# Patient Record
Sex: Male | Born: 1970 | Race: White | Hispanic: No | Marital: Married | Smoking: Never smoker
Health system: Southern US, Community
[De-identification: ages and names within clinical notes are randomized; demographics above are authoritative.]

## PROBLEM LIST (undated history)

## (undated) DIAGNOSIS — I10 Essential (primary) hypertension: Secondary | ICD-10-CM

## (undated) DIAGNOSIS — F419 Anxiety disorder, unspecified: Secondary | ICD-10-CM

## (undated) DIAGNOSIS — F329 Major depressive disorder, single episode, unspecified: Secondary | ICD-10-CM

## (undated) DIAGNOSIS — R519 Headache, unspecified: Secondary | ICD-10-CM

## (undated) DIAGNOSIS — C189 Malignant neoplasm of colon, unspecified: Secondary | ICD-10-CM

## (undated) DIAGNOSIS — F32A Depression, unspecified: Secondary | ICD-10-CM

## (undated) DIAGNOSIS — N189 Chronic kidney disease, unspecified: Secondary | ICD-10-CM

## (undated) DIAGNOSIS — Z87442 Personal history of urinary calculi: Secondary | ICD-10-CM

## (undated) DIAGNOSIS — R51 Headache: Secondary | ICD-10-CM

## (undated) HISTORY — PX: LITHOTRIPSY: SUR834

---

## 1996-09-01 HISTORY — PX: WISDOM TOOTH EXTRACTION: SHX21

## 2003-09-02 HISTORY — PX: GANGLION CYST EXCISION: SHX1691

## 2005-09-01 DIAGNOSIS — I10 Essential (primary) hypertension: Secondary | ICD-10-CM

## 2005-09-01 HISTORY — DX: Essential (primary) hypertension: I10

## 2006-08-31 ENCOUNTER — Encounter: Admission: RE | Admit: 2006-08-31 | Discharge: 2006-08-31 | Payer: Self-pay | Admitting: Family Medicine

## 2015-09-02 DIAGNOSIS — N189 Chronic kidney disease, unspecified: Secondary | ICD-10-CM | POA: Diagnosis present

## 2015-09-02 HISTORY — DX: Chronic kidney disease, unspecified: N18.9

## 2016-01-07 ENCOUNTER — Encounter (HOSPITAL_COMMUNITY): Payer: Self-pay | Admitting: Emergency Medicine

## 2016-01-07 ENCOUNTER — Emergency Department (HOSPITAL_COMMUNITY)
Admission: EM | Admit: 2016-01-07 | Discharge: 2016-01-07 | Disposition: A | Discharge status: Home / Self Care | Payer: BLUE CROSS/BLUE SHIELD | Attending: Emergency Medicine | Admitting: Emergency Medicine

## 2016-01-07 ENCOUNTER — Emergency Department (HOSPITAL_COMMUNITY): Payer: BLUE CROSS/BLUE SHIELD

## 2016-01-07 DIAGNOSIS — N132 Hydronephrosis with renal and ureteral calculous obstruction: Secondary | ICD-10-CM | POA: Diagnosis not present

## 2016-01-07 DIAGNOSIS — I10 Essential (primary) hypertension: Secondary | ICD-10-CM | POA: Insufficient documentation

## 2016-01-07 DIAGNOSIS — N201 Calculus of ureter: Secondary | ICD-10-CM | POA: Insufficient documentation

## 2016-01-07 DIAGNOSIS — K2970 Gastritis, unspecified, without bleeding: Secondary | ICD-10-CM | POA: Diagnosis not present

## 2016-01-07 DIAGNOSIS — Z7951 Long term (current) use of inhaled steroids: Secondary | ICD-10-CM | POA: Insufficient documentation

## 2016-01-07 DIAGNOSIS — Z79899 Other long term (current) drug therapy: Secondary | ICD-10-CM | POA: Diagnosis not present

## 2016-01-07 DIAGNOSIS — R110 Nausea: Secondary | ICD-10-CM | POA: Diagnosis not present

## 2016-01-07 DIAGNOSIS — R109 Unspecified abdominal pain: Secondary | ICD-10-CM | POA: Diagnosis not present

## 2016-01-07 LAB — URINE MICROSCOPIC-ADD ON: Squamous Epithelial / LPF: NONE SEEN

## 2016-01-07 LAB — COMPREHENSIVE METABOLIC PANEL
ALT: 15 U/L — ABNORMAL LOW (ref 17–63)
AST: 19 U/L (ref 15–41)
Albumin: 4.3 g/dL (ref 3.5–5.0)
Alkaline Phosphatase: 72 U/L (ref 38–126)
Anion gap: 9 (ref 5–15)
BUN: 12 mg/dL (ref 6–20)
CO2: 25 mmol/L (ref 22–32)
Calcium: 8.9 mg/dL (ref 8.9–10.3)
Chloride: 111 mmol/L (ref 101–111)
Creatinine, Ser: 1.12 mg/dL (ref 0.61–1.24)
GFR calc Af Amer: >60 mL/min (ref >60)
GFR calc non Af Amer: >60 mL/min (ref >60)
Glucose, Bld: 139 mg/dL — ABNORMAL HIGH (ref 65–99)
Potassium: 3.5 mmol/L (ref 3.5–5.1)
Sodium: 145 mmol/L (ref 135–145)
Total Bilirubin: 0.9 mg/dL (ref 0.3–1.2)
Total Protein: 7.1 g/dL (ref 6.5–8.1)

## 2016-01-07 LAB — CBC WITH DIFFERENTIAL/PLATELET
Basophils Absolute: 0 10*3/uL (ref 0.0–0.1)
Basophils Relative: 0 %
Eosinophils Absolute: 0.2 10*3/uL (ref 0.0–0.7)
Eosinophils Relative: 1 %
HCT: 38.6 % — ABNORMAL LOW (ref 39.0–52.0)
Hemoglobin: 14 g/dL (ref 13.0–17.0)
Lymphocytes Relative: 11 %
Lymphs Abs: 1.4 10*3/uL (ref 0.7–4.0)
MCH: 30.7 pg (ref 26.0–34.0)
MCHC: 36.3 g/dL — ABNORMAL HIGH (ref 30.0–36.0)
MCV: 84.6 fL (ref 78.0–100.0)
Monocytes Absolute: 0.8 10*3/uL (ref 0.1–1.0)
Monocytes Relative: 6 %
Neutro Abs: 10.7 10*3/uL — ABNORMAL HIGH (ref 1.7–7.7)
Neutrophils Relative %: 82 %
Platelets: 198 10*3/uL (ref 150–400)
RBC: 4.56 MIL/uL (ref 4.22–5.81)
RDW: 12.4 % (ref 11.5–15.5)
WBC: 13.1 10*3/uL — ABNORMAL HIGH (ref 4.0–10.5)

## 2016-01-07 LAB — URINALYSIS, ROUTINE W REFLEX MICROSCOPIC
Bilirubin Urine: NEGATIVE
Glucose, UA: NEGATIVE mg/dL
Ketones, ur: NEGATIVE mg/dL
Leukocytes, UA: NEGATIVE
Nitrite: NEGATIVE
Protein, ur: NEGATIVE mg/dL
Specific Gravity, Urine: 1.021 (ref 1.005–1.030)
pH: 6 (ref 5.0–8.0)

## 2016-01-07 MED ORDER — TAMSULOSIN HCL 0.4 MG PO CAPS: 0.4000 mg | ORAL_CAPSULE | Freq: Every day | ORAL | Status: DC

## 2016-01-07 MED ORDER — ONDANSETRON HCL 4 MG/2ML IJ SOLN
4.0000 mg | Freq: Once | INTRAMUSCULAR | Status: AC
  Administered 2016-01-07: 4 mg via INTRAVENOUS
  Filled 2016-01-07: qty 2

## 2016-01-07 MED ORDER — SODIUM CHLORIDE 0.9 % IV SOLN
1000.0000 mL | Freq: Once | INTRAVENOUS | Status: AC
  Administered 2016-01-07: 1000 mL via INTRAVENOUS

## 2016-01-07 MED ORDER — KETOROLAC TROMETHAMINE 30 MG/ML IJ SOLN
30.0000 mg | Freq: Once | INTRAMUSCULAR | Status: AC
  Administered 2016-01-07: 30 mg via INTRAVENOUS
  Filled 2016-01-07: qty 1

## 2016-01-07 MED ORDER — ONDANSETRON 8 MG PO TBDP: 8.0000 mg | ORAL_TABLET | Freq: Three times a day (TID) | ORAL | Status: DC | PRN

## 2016-01-07 MED ORDER — OXYCODONE-ACETAMINOPHEN 5-325 MG PO TABS: 1.0000 | ORAL_TABLET | ORAL | Status: DC | PRN

## 2016-01-07 MED ORDER — IBUPROFEN 600 MG PO TABS: 600.0000 mg | ORAL_TABLET | Freq: Three times a day (TID) | ORAL | Status: DC | PRN

## 2016-01-07 MED ORDER — HYDROMORPHONE HCL 1 MG/ML IJ SOLN
1.0000 mg | INTRAMUSCULAR | Status: DC | PRN
  Administered 2016-01-07 (×2): 1 mg via INTRAVENOUS
  Filled 2016-01-07 (×2): qty 1

## 2016-01-07 MED ORDER — SODIUM CHLORIDE 0.9 % IV SOLN
1000.0000 mL | INTRAVENOUS | Status: DC
  Administered 2016-01-07: 1000 mL via INTRAVENOUS

## 2016-01-07 MED ORDER — HYDROMORPHONE HCL 1 MG/ML IJ SOLN: 1.0000 mg | Freq: Once | INTRAMUSCULAR | Status: DC

## 2016-01-07 NOTE — ED Notes (Signed)
Pt reports understanding of discharge information. No questions at time of discharge 

## 2016-01-07 NOTE — ED Notes (Signed)
Per EMS pt reports abd pain in right lower abd with nausea and vomiting since Friday. Pt reports having 5 episodes of vomiting. EMS administered 4mg  Zofran IVP and 100mg  Fentanyl IVP.

## 2016-01-07 NOTE — ED Notes (Signed)
Patient transported to CT 

## 2016-01-07 NOTE — Discharge Instructions (Signed)
Renal Colic  Renal colic is pain that is caused by passing a kidney stone. The pain can be sharp and severe. It may be felt in the back, abdomen, side (flank), or groin. It can cause nausea. Renal colic can come and go.  HOME CARE INSTRUCTIONS  Watch your condition for any changes. The following actions may help to lessen any discomfort that you are feeling:  · Take medicines only as directed by your health care provider.  · Ask your health care provider if it is okay to take over-the-counter pain medicine.  · Drink enough fluid to keep your urine clear or pale yellow. Drink 6-8 glasses of water each day.  · Limit the amount of salt that you eat to less than 2 grams per day.  · Reduce the amount of protein in your diet. Eat less meat, fish, nuts, and dairy.  · Avoid foods such as spinach, rhubarb, nuts, or bran. These may make kidney stones more likely to form.  SEEK MEDICAL CARE IF:  · You have a fever or chills.  · Your urine smells bad or looks cloudy.  · You have pain or burning when you pass urine.  SEEK IMMEDIATE MEDICAL CARE IF:  · Your flank pain or groin pain suddenly worsens.  · You become confused or disoriented or you lose consciousness.     This information is not intended to replace advice given to you by your health care provider. Make sure you discuss any questions you have with your health care provider.     Document Released: 05/28/2005 Document Revised: 09/08/2014 Document Reviewed: 06/28/2014  Elsevier Interactive Patient Education ©2016 [employer]vier [employer]

## 2016-01-07 NOTE — ED Notes (Signed)
Bed: [id number] Expected date:  Expected time:  Means of arrival:  Comments: EMS 86M nausea, abd pain, no BM for several days

## 2016-01-07 NOTE — ED Notes (Signed)
Pt reports increase in pain. Also pt informed of need for urine sample and that catherization would be done if unable to urinate.

## 2016-01-07 NOTE — ED Notes (Signed)
Writer made pt aware that urine is needed for sample, urinal at bedside.

## 2016-01-07 NOTE — ED Notes (Signed)
MD at bedside. 

## 2016-01-07 NOTE — ED Provider Notes (Signed)
CSN: [id number]     Arrival date & time 01/07/16  0118 History  By signing my name below, I, Corey Gregory, attest that this documentation has been prepared under the direction and in the presence of [name], MD. Electronically Signed: Virgel Bouquet, ED Scribe. 01/07/2016. 1:43 AM.   Chief Complaint  Patient presents with  . Abdominal Pain   The history is provided by the patient. No language interpreter was used.   HPI Comments: Corey Gregory is a 45 y.o. male brought in by EMS with an hx of HTN who presents to the Emergency Department complaining of intermittent, moderate abdominal pain onset 3 days ago. Pt states that the pain lasts for "a little while" before resolving and intermittently radiates to his back. He notes that pain began shortly after eating a meal 3 days ago, followed by nausea and emesis later that night.  Abdominal pain is unchanged by movement. He takes HTN medications regularly. Denies similar symptoms in the past. Denies hx of kidney stones. Denies urinary symptoms, melena, hematochezia, or any other symptoms currently.  History reviewed. No pertinent past medical history. History reviewed. No pertinent past surgical history. History reviewed. No pertinent family history. Social History  Substance Use Topics  . Smoking status: Never Smoker   . Smokeless tobacco: Never Used  . Alcohol Use: No    Review of Systems A complete 10 system review of systems was obtained and all systems are negative except as noted in the HPI and PMH.   Allergies  Review of patient's allergies indicates no known allergies.  Home Medications   Prior to Admission medications   Medication Sig Start Date End Date Taking? Authorizing Provider  ALPRAZolam (XANAX) 0.25 MG tablet Take 0.25 mg by mouth 2 (two) times daily as needed. Anxiety 12/26/15  Yes Historical Provider, MD  butorphanol (STADOL) 10 MG/ML nasal spray Place 1 spray into the nose 2 (two) times daily as needed for  headache.  12/23/15  Yes Historical Provider, MD  lisinopril-hydrochlorothiazide (PRINZIDE,ZESTORETIC) 20-12.5 MG tablet Take 1 tablet by mouth daily.   Yes Historical Provider, MD  PARoxetine (PAXIL) 20 MG tablet Take 20 mg by mouth every morning. 10/08/15  Yes Historical Provider, MD   BP 131/85 mmHg  Pulse 79  Temp(Src) 98.9 F (37.2 C) (Oral)  Resp 18  Ht 6\' 3"  (1.905 m)  Wt 255 lb (115.667 kg)  BMI 31.87 kg/m2  SpO2 97% Physical Exam  Constitutional: He is oriented to person, place, and time. He appears well-developed and well-nourished.  Uncomfortable appearing.  HENT:  Head: Normocephalic and atraumatic.  Eyes: EOM are normal.  Neck: Normal range of motion.  Cardiovascular: Normal rate, regular rhythm, normal heart sounds and intact distal pulses.   Pulmonary/Chest: Effort normal and breath sounds normal. No respiratory distress.  Abdominal: Soft. He exhibits no distension. There is tenderness in the right lower quadrant.  Mild RLQ tenderness  Musculoskeletal: Normal range of motion.  Neurological: He is alert and oriented to person, place, and time.  Skin: Skin is warm and dry.  Psychiatric: He has a normal mood and affect. Judgment normal.  Nursing note and vitals reviewed.   ED Course  Procedures   DIAGNOSTIC STUDIES: Oxygen Saturation is 93% on RA, adequate by my interpretation.    COORDINATION OF CARE: 1:42 AM Will order pain medication and abdominal CT scan. Discussed treatment plan with pt at bedside and pt agreed to plan.  Labs Review Labs Reviewed  CBC WITH DIFFERENTIAL/PLATELET - Abnormal; Notable  for the following:    WBC 13.1 (*)    HCT 38.6 (*)    MCHC 36.3 (*)    Neutro Abs 10.7 (*)    All other components within normal limits  COMPREHENSIVE METABOLIC PANEL - Abnormal; Notable for the following:    Glucose, Bld 139 (*)    ALT 15 (*)    All other components within normal limits  URINALYSIS, ROUTINE W REFLEX MICROSCOPIC (NOT AT [facility]) - Abnormal;  Notable for the following:    APPearance CLOUDY (*)    Hgb urine dipstick MODERATE (*)    All other components within normal limits  URINE MICROSCOPIC-ADD ON - Abnormal; Notable for the following:    Bacteria, UA RARE (*)    Crystals CA OXALATE CRYSTALS (*)    All other components within normal limits    Imaging Review Ct Renal Stone Study  01/07/2016  CLINICAL DATA:  Initial evaluation for acute right-sided pain, nausea, vomiting. EXAM: CT ABDOMEN AND PELVIS WITHOUT CONTRAST TECHNIQUE: Multidetector CT imaging of the abdomen and pelvis was performed following the standard protocol without IV contrast. COMPARISON:  None. FINDINGS: Patchy atelectatic changes present within the visualized lingula and lower lobes. Visualized lungs are otherwise clear. Cardiomegaly partially visualized. Trace pericardial fluid noted. Liver demonstrates a normal unenhanced appearance. Gallbladder within normal limits. No biliary dilatation. Subcentimeter hypodensity within the spleen noted, of doubtful clinical significance. Spleen otherwise unremarkable. Adrenal glands and pancreas within normal limits. Large parapelvic cyst measuring 7 Monday by 7.3 cm present within the left kidney. 3 mm nonobstructive left renal calculus present. No left-sided hydronephrosis or hydroureter. No radiopaque calculi seen along the course of the left ureter. On the right, there is an obstructive 13 mm stone lodged at the right UPJ with secondary moderate right hydronephrosis. Right perinephric and periureteral fat stranding. Right ureter is decompressed distally without additional stones. Additional nonobstructive 8 mm calculus within the interpolar right kidney. Few additional smaller subcentimeter nonobstructive stones noted within the upper and lower poles of the right kidney. Stomach within normal limits. No evidence for bowel obstruction. No abnormal wall thickening, mucosal enhancement, or inflammatory fat stranding seen about the bowels.  Appendix is normal. Bladder within normal limits.  Prostate normal. No free air or fluid.  No adenopathy. Mild levoscoliosis. No acute osseus abnormality. No worrisome lytic or blastic osseous lesions. Degenerative disc bulge noted at L5-S1. IMPRESSION: 1. 13 mm obstructive stone at the right UPJ with secondary moderate right hydronephrosis. 2. Additional nonobstructive bilateral nephrolithiasis as above. 3. No other acute intra-abdominal or pelvic process. 4. Mild levoscoliosis with degenerative disc bulge at L5-S1. 5. Cardiomegaly. Electronically Signed   By: Jeannine Boga M.D.   On: 01/07/2016 02:42   I have personally reviewed and evaluated these images and lab results as part of my medical decision-making.   EKG Interpretation None      MDM   Final diagnoses:  Right ureteral stone    5:02 AM Large 13 mm distal right ureteral stone.  No signs of infection.  Pain controlled at this time.  Patient understands return to the emergency department for new or worsening symptoms.  Close outpatient urology follow-up.  Strict return precautions and standard stone precautions given.  Patient and family in agreement.  All questions answered  I personally performed the services described in this documentation, which was scribed in my presence. The recorded information has been reviewed and is accurate.       [name], MD 01/07/16 [phone]

## 2016-01-08 ENCOUNTER — Other Ambulatory Visit: Payer: Self-pay | Admitting: Urology

## 2016-01-08 DIAGNOSIS — Z0000 Encounter for general adult medical examination without abnormal findings: Secondary | ICD-10-CM | POA: Diagnosis not present

## 2016-01-08 DIAGNOSIS — N201 Calculus of ureter: Secondary | ICD-10-CM | POA: Diagnosis not present

## 2016-01-09 ENCOUNTER — Encounter (HOSPITAL_COMMUNITY): Payer: Self-pay | Admitting: *Deleted

## 2016-01-10 ENCOUNTER — Ambulatory Visit (HOSPITAL_COMMUNITY): Payer: BLUE CROSS/BLUE SHIELD

## 2016-01-10 ENCOUNTER — Ambulatory Visit (HOSPITAL_COMMUNITY)
Admission: RE | Admit: 2016-01-10 | Discharge: 2016-01-10 | Disposition: A | Discharge status: Home / Self Care | Payer: BLUE CROSS/BLUE SHIELD | Source: Ambulatory Visit | Attending: Urology | Admitting: Urology

## 2016-01-10 ENCOUNTER — Encounter (HOSPITAL_COMMUNITY): Payer: Self-pay | Admitting: *Deleted

## 2016-01-10 ENCOUNTER — Encounter (HOSPITAL_COMMUNITY)
Admission: RE | Disposition: A | Discharge status: Home / Self Care | Payer: Self-pay | Source: Ambulatory Visit | Attending: Urology

## 2016-01-10 DIAGNOSIS — F329 Major depressive disorder, single episode, unspecified: Secondary | ICD-10-CM | POA: Insufficient documentation

## 2016-01-10 DIAGNOSIS — Z6830 Body mass index (BMI) 30.0-30.9, adult: Secondary | ICD-10-CM | POA: Insufficient documentation

## 2016-01-10 DIAGNOSIS — N201 Calculus of ureter: Secondary | ICD-10-CM

## 2016-01-10 DIAGNOSIS — Z79899 Other long term (current) drug therapy: Secondary | ICD-10-CM | POA: Insufficient documentation

## 2016-01-10 DIAGNOSIS — Z841 Family history of disorders of kidney and ureter: Secondary | ICD-10-CM | POA: Diagnosis not present

## 2016-01-10 DIAGNOSIS — N200 Calculus of kidney: Secondary | ICD-10-CM | POA: Diagnosis not present

## 2016-01-10 DIAGNOSIS — I10 Essential (primary) hypertension: Secondary | ICD-10-CM | POA: Insufficient documentation

## 2016-01-10 DIAGNOSIS — F419 Anxiety disorder, unspecified: Secondary | ICD-10-CM | POA: Insufficient documentation

## 2016-01-10 DIAGNOSIS — E669 Obesity, unspecified: Secondary | ICD-10-CM | POA: Diagnosis not present

## 2016-01-10 HISTORY — DX: Major depressive disorder, single episode, unspecified: F32.9

## 2016-01-10 HISTORY — DX: Depression, unspecified: F32.A

## 2016-01-10 HISTORY — DX: Essential (primary) hypertension: I10

## 2016-01-10 HISTORY — DX: Headache: R51

## 2016-01-10 HISTORY — DX: Chronic kidney disease, unspecified: N18.9

## 2016-01-10 HISTORY — DX: Anxiety disorder, unspecified: F41.9

## 2016-01-10 HISTORY — DX: Headache, unspecified: R51.9

## 2016-01-10 SURGERY — LITHOTRIPSY, ESWL
Anesthesia: LOCAL | Laterality: Right

## 2016-01-10 MED ORDER — DIPHENHYDRAMINE HCL 25 MG PO CAPS
25.0000 mg | ORAL_CAPSULE | ORAL | Status: AC
  Administered 2016-01-10: 25 mg via ORAL
  Filled 2016-01-10: qty 1

## 2016-01-10 MED ORDER — HYDRALAZINE HCL 20 MG/ML IJ SOLN
10.0000 mg | Freq: Once | INTRAMUSCULAR | Status: DC
  Filled 2016-01-10: qty 1

## 2016-01-10 MED ORDER — HYDRALAZINE HCL 20 MG/ML IJ SOLN
10.0000 mg | Freq: Once | INTRAMUSCULAR | Status: AC
  Administered 2016-01-10: 10 mg via INTRAVENOUS

## 2016-01-10 MED ORDER — DIAZEPAM 5 MG PO TABS
10.0000 mg | ORAL_TABLET | ORAL | Status: AC
  Administered 2016-01-10: 10 mg via ORAL
  Filled 2016-01-10: qty 2

## 2016-01-10 MED ORDER — SODIUM CHLORIDE 0.9 % IV SOLN
INTRAVENOUS | Status: DC
  Administered 2016-01-10: 09:00:00 via INTRAVENOUS

## 2016-01-10 MED ORDER — HYDRALAZINE HCL 20 MG/ML IJ SOLN: 10.0000 mg | Freq: Once | INTRAMUSCULAR | Status: DC

## 2016-01-10 MED ORDER — CIPROFLOXACIN HCL 500 MG PO TABS
500.0000 mg | ORAL_TABLET | ORAL | Status: AC
  Administered 2016-01-10: 500 mg via ORAL
  Filled 2016-01-10: qty 1

## 2016-01-10 NOTE — Op Note (Signed)
See [facility] OP note scanned into chart. Also because of the size, density, location and other factors that cannot be anticipated I feel this will likely be a staged procedure. This fact supersedes any indication in the scanned [facility] operative note to the contrary.  

## 2016-01-10 NOTE — Progress Notes (Signed)
BP 171/98 Apresoline 10mg  IV given as ordered. Pt resting comfortably in chair

## 2016-01-10 NOTE — Progress Notes (Signed)
Hypertension post procedure reported to Dr Louis Meckel.  See orders.

## 2016-01-10 NOTE — H&P (Signed)
Reason For Visit Right-sided kidney stones   History of Present Illness 45 year old male who was seen in follow-up from the emergency department where he was seen on 01/07/16 for progressively worsening right-sided abdominal pain and flank pain. The patient states that his symptoms began 3 days prior to presentation. He describes the pain as a very sharp intense pain in the right costal vertebral angle/flank. The pain is worse with palpation. It radiates across to the front. He has had associated nausea and vomiting. He denies any voiding symptoms. The pain has been constant but relieved with narcotic pain medications. He denies any fevers or chills. The patient has no history of kidney stones. In the emergency department the patient underwent a CT scan which demonstrated a 12-13 mm stone at the right UPJ with associated hydronephrosis. His pain was relatively well-controlled and there was no evidence of acute kidney injury or infection. Subsequently was discharged home with pain control. In the interval the patient has had ongoing pain but this is being controlled with oxycodone. Without the pain medications the patient is unbearable.   Past Medical History Problems  1. History of Anxiety (F41.9) 2. History of depression (Z86.59) 3. History of hypertension (Z86.79)  Surgical History Problems  1. History of No Surgical Problems  Current Meds 1. Butorphanol Tartrate 10 MG/ML Nasal Solution;  Therapy: (Recorded:09May2017) to Recorded 2. Lisinopril TABS;  Therapy: (Recorded:09May2017) to Recorded 3. Paxil 20 MG Oral Tablet (PARoxetine HCl);  Therapy: (Recorded:09May2017) to Recorded 4. Xanax TABS (ALPRAZolam);  Therapy: (Recorded:09May2017) to Recorded  Allergies Medication  1. No Known Drug Allergies  Family History Problems  1. Family history of kidney stones (Z84.1) : Father 2. Family history of malignant neoplasm of prostate [id number]) : Father  Social History Problems    Denied:  History of Alcohol use   Denied: History of Caffeine use   Married   Never a smoker   Number of children   1 son   Occupation   Animator  Review of Systems Genitourinary, constitutional, skin, eye, otolaryngeal, hematologic/lymphatic, cardiovascular, pulmonary, endocrine, musculoskeletal, gastrointestinal, neurological and psychiatric system(s) were reviewed and pertinent findings if present are noted and are otherwise negative.  Genitourinary: urinary frequency and nocturia.  Gastrointestinal: nausea, vomiting and constipation.  Constitutional: fever, night sweats and feeling tired (fatigue).    Vitals Vital Signs [Data Includes: Last 1 Day]  Recorded: [id number] 12:46PM  Height: 6 ft 3 in Weight: 250 lb  BMI Calculated: 31.25 BSA Calculated: 2.41 Blood Pressure: 145 / 88 Temperature: 97.9 F Heart Rate: 74  Physical Exam Constitutional: Well nourished and well developed . In acute distress.  ENT:. The ears and nose are normal in appearance.  Neck: The appearance of the neck is normal and no neck mass is present.  Pulmonary: No respiratory distress and normal respiratory rhythm and effort.  Cardiovascular: Heart rate and rhythm are normal . No peripheral edema.  Abdomen: The abdomen is soft and nontender. No masses are palpated. Right CVA tenderness. No hernias are palpable. No hepatosplenomegaly noted.  Rectal: Rectal exam demonstrates normal sphincter tone, no tenderness and no masses. The prostate has no nodularity and is not tender. The left seminal vesicle is nonpalpable. The right seminal vesicle is nonpalpable. The perineum is normal on inspection.  Genitourinary: Examination of the penis demonstrates no discharge, no masses, no lesions and a normal meatus. The scrotum is without lesions. The right epididymis is palpably normal and non-tender. The left epididymis is palpably normal and non-tender. The right testis is  non-tender and without masses. The left testis is  non-tender and without masses.  Lymphatics: The femoral and inguinal nodes are not enlarged or tender.  Skin: Normal skin turgor, no visible rash and no visible skin lesions.  Neuro/Psych:. Mood and affect are appropriate.    Results/Data Urine [Data Includes: Last 1 Day]   [id number]  COLOR YELLOW   APPEARANCE CLEAR   SPECIFIC GRAVITY 1.025   pH 5.5   GLUCOSE NEGATIVE   BILIRUBIN NEGATIVE   KETONE NEGATIVE   BLOOD 2+   PROTEIN NEGATIVE   NITRITE NEGATIVE   LEUKOCYTE ESTERASE NEGATIVE   SQUAMOUS EPITHELIAL/HPF 0-5 HPF  WBC NONE SEEN WBC/HPF  RBC 3-10 RBC/HPF  BACTERIA NONE SEEN HPF  CRYSTALS NONE SEEN HPF  CASTS NONE SEEN LPF  Yeast NONE SEEN HPF   I've independently reviewed the patient's CT scan as well as read the results.  Urinalysis today demonstrates microscopic hematuria without evidence of infection.   Assessment Assessed  1. Right ureteral calculus (N20.1)  Plan Health Maintenance  1. UA With REFLEX; [Do Not Release]; Status:Complete;   Done: [id number] 12:22PM Right ureteral calculus  2. Start: OxyCODONE HCl - 5 MG Oral Tablet; TAKE 1 TO 2 TABLETS EVERY 4 HOURS AS  NEEDED FOR PAIN 3. Follow-up Schedule Surgery Office  Follow-up  Status: Hold For - Appointment   Requested for: [id number]  Discussion/Summary The patient has a large proximal right ureteral stone. We went over the treatment options with the patient including urgent stent placement given his poorly controlled pain today. Also discussed scheduled ureteroscopy over the next day or 2. And finally we discussed shockwave lithotripsy. I went over all of these options with the patient in significant detail. Ultimately, the patient has opted to proceed with shockwave lithotripsy. I spoke with the patient about large volume of stone and the chance that he may require additional procedures due to poor fragmentation. We also briefly discussed the concept of perinephric hematoma. However after detailing the risks  and the benefits of the operation the patient would like to proceed. We'll try to get him scheduled in the coming days for the next shockwave lithotripsy appointment.

## 2016-01-10 NOTE — Discharge Instructions (Signed)
Moderate Conscious Sedation, Adult, Care After Refer to this sheet in the next few weeks. These instructions provide you with information on caring for yourself after your procedure. Your health care provider may also give you more specific instructions. Your treatment has been planned according to current medical practices, but problems sometimes occur. Call your health care provider if you have any problems or questions after your procedure. WHAT TO EXPECT AFTER THE PROCEDURE  After your procedure:  You may feel sleepy, clumsy, and have poor balance for several hours.  Vomiting may occur if you eat too soon after the procedure. HOME CARE INSTRUCTIONS  Do not participate in any activities where you could become injured for at least 24 hours. Do not:  Drive.  Swim.  Ride a bicycle.  Operate heavy machinery.  Cook.  Use power tools.  Climb ladders.  Work from a high place.  Do not make important decisions or sign legal documents until you are improved.  If you vomit, drink water, juice, or soup when you can drink without vomiting. Make sure you have little or no nausea before eating solid foods.  Only take over-the-counter or prescription medicines for pain, discomfort, or fever as directed by your health care provider.  Make sure you and your family fully understand everything about the medicines given to you, including what side effects may occur.  You should not drink alcohol, take sleeping pills, or take medicines that cause drowsiness for at least 24 hours.  If you smoke, do not smoke without supervision.  If you are feeling better, you may resume normal activities 24 hours after you were sedated.  Keep all appointments with your health care provider. SEEK MEDICAL CARE IF:  Your skin is pale or bluish in color.  You continue to feel nauseous or vomit.  Your pain is getting worse and is not helped by medicine.  You have bleeding or swelling.  You are still  sleepy or feeling clumsy after 24 hours. SEEK IMMEDIATE MEDICAL CARE IF:  You develop a rash.  You have difficulty breathing.  You develop any type of allergic problem.  You have a fever. MAKE SURE YOU:  Understand these instructions.  Will watch your condition.  Will get help right away if you are not doing well or get worse.   This information is not intended to replace advice given to you by your health care provider. Make sure you discuss any questions you have with your health care provider.   Document Released: 06/08/2013 Document Revised: 09/08/2014 Document Reviewed: 06/08/2013 Elsevier Interactive Patient Education 2016 [facility] discharge instructions

## 2016-01-24 DIAGNOSIS — N201 Calculus of ureter: Secondary | ICD-10-CM | POA: Diagnosis not present

## 2016-01-24 DIAGNOSIS — Z0000 Encounter for general adult medical examination without abnormal findings: Secondary | ICD-10-CM | POA: Diagnosis not present

## 2016-02-28 DIAGNOSIS — N201 Calculus of ureter: Secondary | ICD-10-CM | POA: Diagnosis not present

## 2016-03-03 ENCOUNTER — Other Ambulatory Visit: Payer: Self-pay | Admitting: Urology

## 2016-03-11 ENCOUNTER — Encounter (HOSPITAL_COMMUNITY): Payer: Self-pay

## 2016-03-11 ENCOUNTER — Encounter (HOSPITAL_COMMUNITY)
Admission: RE | Admit: 2016-03-11 | Discharge: 2016-03-11 | Disposition: A | Discharge status: Home / Self Care | Payer: BLUE CROSS/BLUE SHIELD | Source: Ambulatory Visit | Attending: Urology | Admitting: Urology

## 2016-03-11 DIAGNOSIS — Z79899 Other long term (current) drug therapy: Secondary | ICD-10-CM | POA: Diagnosis not present

## 2016-03-11 DIAGNOSIS — F329 Major depressive disorder, single episode, unspecified: Secondary | ICD-10-CM | POA: Diagnosis not present

## 2016-03-11 DIAGNOSIS — I10 Essential (primary) hypertension: Secondary | ICD-10-CM | POA: Diagnosis not present

## 2016-03-11 DIAGNOSIS — Z841 Family history of disorders of kidney and ureter: Secondary | ICD-10-CM | POA: Diagnosis not present

## 2016-03-11 DIAGNOSIS — N132 Hydronephrosis with renal and ureteral calculous obstruction: Secondary | ICD-10-CM | POA: Diagnosis not present

## 2016-03-11 DIAGNOSIS — F419 Anxiety disorder, unspecified: Secondary | ICD-10-CM | POA: Diagnosis not present

## 2016-03-11 LAB — BASIC METABOLIC PANEL
Anion gap: 6 (ref 5–15)
BUN: 14 mg/dL (ref 6–20)
CO2: 29 mmol/L (ref 22–32)
Calcium: 9.4 mg/dL (ref 8.9–10.3)
Chloride: 104 mmol/L (ref 101–111)
Creatinine, Ser: 1.16 mg/dL (ref 0.61–1.24)
GFR calc Af Amer: >60 mL/min (ref >60)
GFR calc non Af Amer: >60 mL/min (ref >60)
Glucose, Bld: 104 mg/dL — ABNORMAL HIGH (ref 65–99)
Potassium: 4 mmol/L (ref 3.5–5.1)
Sodium: 139 mmol/L (ref 135–145)

## 2016-03-11 LAB — CBC
HCT: 40.4 % (ref 39.0–52.0)
Hemoglobin: 14.1 g/dL (ref 13.0–17.0)
MCH: 30.1 pg (ref 26.0–34.0)
MCHC: 34.9 g/dL (ref 30.0–36.0)
MCV: 86.3 fL (ref 78.0–100.0)
Platelets: 205 10*3/uL (ref 150–400)
RBC: 4.68 MIL/uL (ref 4.22–5.81)
RDW: 12.7 % (ref 11.5–15.5)
WBC: 8.9 10*3/uL (ref 4.0–10.5)

## 2016-03-11 NOTE — Patient Instructions (Signed)
Corey Gregory  03/11/2016   Your procedure is scheduled on: Friday 03/14/2016  Report to [facility]  Entrance take [place]  elevators to 3rd floor to  [place] at [place]  AM.  Call this number if you have problems the morning of surgery [phone]   Remember: ONLY 1 PERSON MAY GO WITH YOU TO SHORT STAY TO GET  READY MORNING OF [place].   Do not eat food or drink liquids :After Midnight.     Take these medicines the morning of surgery with A SIP OF WATER: none                                  You may not have any metal on your body including hair pins and              piercings  Do not wear jewelry, make-up, lotions, powders or perfumes, deodorant             Do not wear nail polish.  Do not shave  48 hours prior to surgery.              Men may shave face and neck.   Do not bring valuables to the hospital. [place].  Contacts, dentures or bridgework may not be worn into surgery.  Leave suitcase in the car. After surgery it may be brought to your room.     Patients discharged the day of surgery will not be allowed to drive home.  Name and phone number of your driver:spouse- Meredith  Special Instructions: N/A              Please read over the following fact sheets you were given: _____________________________________________________________________             [facility] - Preparing for Surgery Before surgery, you can play an important role.  Because skin is not sterile, your skin needs to be as free of germs as possible.  You can reduce the number of germs on your skin by washing with CHG (chlorahexidine gluconate) soap before surgery.  CHG is an antiseptic cleaner which kills germs and bonds with the skin to continue killing germs even after washing. Please DO NOT use if you have an allergy to CHG or antibacterial soaps.  If your skin becomes reddened/irritated stop using the CHG and  inform your nurse when you arrive at Short Stay. Do not shave (including legs and underarms) for at least 48 hours prior to the first CHG shower.  You may shave your face/neck. Please follow these instructions carefully:  1.  Shower with CHG Soap the night before surgery and the  morning of Surgery.  2.  If you choose to wash your hair, wash your hair first as usual with your  normal  shampoo.  3.  After you shampoo, rinse your hair and body thoroughly to remove the  shampoo.                           4.  Use CHG as you would any other liquid soap.  You can apply chg directly  to the skin and wash  Gently with a scrungie or clean washcloth.  5.  Apply the CHG Soap to your body ONLY FROM THE NECK DOWN.   Do not use on face/ open                           Wound or open sores. Avoid contact with eyes, ears mouth and genitals (private parts).                       Wash face,  Genitals (private parts) with your normal soap.             6.  Wash thoroughly, paying special attention to the area where your surgery  will be performed.  7.  Thoroughly rinse your body with warm water from the neck down.  8.  DO NOT shower/wash with your normal soap after using and rinsing off  the CHG Soap.                9.  Pat yourself dry with a clean towel.            10.  Wear clean pajamas.            11.  Place clean sheets on your bed the night of your first shower and do not  sleep with pets. Day of Surgery : Do not apply any lotions/deodorants the morning of surgery.  Please wear clean clothes to the hospital/surgery center.  FAILURE TO FOLLOW THESE INSTRUCTIONS MAY RESULT IN THE CANCELLATION OF YOUR SURGERY PATIENT SIGNATURE_________________________________  NURSE SIGNATURE__________________________________  ________________________________________________________________________

## 2016-03-14 ENCOUNTER — Ambulatory Visit (HOSPITAL_COMMUNITY)
Admission: RE | Admit: 2016-03-14 | Discharge: 2016-03-14 | Disposition: A | Discharge status: Home / Self Care | Payer: BLUE CROSS/BLUE SHIELD | Source: Ambulatory Visit | Attending: Urology | Admitting: Urology

## 2016-03-14 ENCOUNTER — Ambulatory Visit (HOSPITAL_COMMUNITY): Payer: BLUE CROSS/BLUE SHIELD | Admitting: Registered Nurse

## 2016-03-14 ENCOUNTER — Encounter (HOSPITAL_COMMUNITY)
Admission: RE | Disposition: A | Discharge status: Home / Self Care | Payer: Self-pay | Source: Ambulatory Visit | Attending: Urology

## 2016-03-14 ENCOUNTER — Encounter (HOSPITAL_COMMUNITY): Payer: Self-pay | Admitting: *Deleted

## 2016-03-14 DIAGNOSIS — N201 Calculus of ureter: Secondary | ICD-10-CM | POA: Diagnosis not present

## 2016-03-14 DIAGNOSIS — F329 Major depressive disorder, single episode, unspecified: Secondary | ICD-10-CM | POA: Insufficient documentation

## 2016-03-14 DIAGNOSIS — F419 Anxiety disorder, unspecified: Secondary | ICD-10-CM | POA: Diagnosis not present

## 2016-03-14 DIAGNOSIS — I10 Essential (primary) hypertension: Secondary | ICD-10-CM | POA: Insufficient documentation

## 2016-03-14 DIAGNOSIS — Z841 Family history of disorders of kidney and ureter: Secondary | ICD-10-CM | POA: Insufficient documentation

## 2016-03-14 DIAGNOSIS — N200 Calculus of kidney: Secondary | ICD-10-CM

## 2016-03-14 DIAGNOSIS — N132 Hydronephrosis with renal and ureteral calculous obstruction: Secondary | ICD-10-CM | POA: Insufficient documentation

## 2016-03-14 DIAGNOSIS — Z79899 Other long term (current) drug therapy: Secondary | ICD-10-CM | POA: Insufficient documentation

## 2016-03-14 HISTORY — PX: HOLMIUM LASER APPLICATION: SHX5852

## 2016-03-14 HISTORY — PX: CYSTOSCOPY WITH RETROGRADE PYELOGRAM, URETEROSCOPY AND STENT PLACEMENT: SHX5789

## 2016-03-14 SURGERY — CYSTOURETEROSCOPY, WITH RETROGRADE PYELOGRAM AND STENT INSERTION
Anesthesia: General | Site: Ureter | Laterality: Right

## 2016-03-14 MED ORDER — DEXAMETHASONE SODIUM PHOSPHATE 10 MG/ML IJ SOLN
INTRAMUSCULAR | Status: DC | PRN
  Administered 2016-03-14: 10 mg via INTRAVENOUS

## 2016-03-14 MED ORDER — ONDANSETRON HCL 4 MG/2ML IJ SOLN
INTRAMUSCULAR | Status: DC | PRN
  Administered 2016-03-14: 4 mg via INTRAVENOUS

## 2016-03-14 MED ORDER — PROPOFOL 10 MG/ML IV BOLUS
INTRAVENOUS | Status: DC | PRN
  Administered 2016-03-14: 200 mg via INTRAVENOUS
  Administered 2016-03-14: 30 mg via INTRAVENOUS
  Administered 2016-03-14: 50 mg via INTRAVENOUS

## 2016-03-14 MED ORDER — MIDAZOLAM HCL 5 MG/5ML IJ SOLN
INTRAMUSCULAR | Status: DC | PRN
  Administered 2016-03-14: 2 mg via INTRAVENOUS

## 2016-03-14 MED ORDER — KETOROLAC TROMETHAMINE 30 MG/ML IJ SOLN
INTRAMUSCULAR | Status: DC | PRN
  Administered 2016-03-14: 30 mg via INTRAVENOUS

## 2016-03-14 MED ORDER — TRAMADOL HCL 50 MG PO TABS: 50.0000 mg | ORAL_TABLET | Freq: Four times a day (QID) | ORAL | Status: DC | PRN

## 2016-03-14 MED ORDER — LIDOCAINE HCL 2 % EX GEL
CUTANEOUS | Status: AC
  Filled 2016-03-14: qty 5

## 2016-03-14 MED ORDER — PROPOFOL 10 MG/ML IV BOLUS
INTRAVENOUS | Status: AC
  Filled 2016-03-14: qty 20

## 2016-03-14 MED ORDER — 0.9 % SODIUM CHLORIDE (POUR BTL) OPTIME
TOPICAL | Status: DC | PRN
  Administered 2016-03-14: 1000 mL

## 2016-03-14 MED ORDER — FENTANYL CITRATE (PF) 100 MCG/2ML IJ SOLN
INTRAMUSCULAR | Status: DC | PRN
  Administered 2016-03-14 (×5): 50 ug via INTRAVENOUS

## 2016-03-14 MED ORDER — CEFAZOLIN SODIUM-DEXTROSE 2-4 GM/100ML-% IV SOLN
2.0000 g | INTRAVENOUS | Status: AC
  Administered 2016-03-14: 2 g via INTRAVENOUS

## 2016-03-14 MED ORDER — LIDOCAINE HCL (CARDIAC) 20 MG/ML IV SOLN
INTRAVENOUS | Status: AC
  Filled 2016-03-14: qty 5

## 2016-03-14 MED ORDER — CIPROFLOXACIN HCL 500 MG PO TABS: 500.0000 mg | ORAL_TABLET | Freq: Once | ORAL | Status: DC

## 2016-03-14 MED ORDER — IOHEXOL 300 MG/ML  SOLN
INTRAMUSCULAR | Status: DC | PRN
  Administered 2016-03-14: 50 mL

## 2016-03-14 MED ORDER — KETOROLAC TROMETHAMINE 30 MG/ML IJ SOLN
INTRAMUSCULAR | Status: AC
  Filled 2016-03-14: qty 1

## 2016-03-14 MED ORDER — DEXAMETHASONE SODIUM PHOSPHATE 10 MG/ML IJ SOLN
INTRAMUSCULAR | Status: AC
  Filled 2016-03-14: qty 1

## 2016-03-14 MED ORDER — FENTANYL CITRATE (PF) 250 MCG/5ML IJ SOLN
INTRAMUSCULAR | Status: AC
  Filled 2016-03-14: qty 5

## 2016-03-14 MED ORDER — MIDAZOLAM HCL 2 MG/2ML IJ SOLN
INTRAMUSCULAR | Status: AC
  Filled 2016-03-14: qty 2

## 2016-03-14 MED ORDER — LIDOCAINE HCL 2 % EX GEL
CUTANEOUS | Status: DC | PRN
  Administered 2016-03-14: 1

## 2016-03-14 MED ORDER — ONDANSETRON HCL 4 MG/2ML IJ SOLN
INTRAMUSCULAR | Status: AC
  Filled 2016-03-14: qty 2

## 2016-03-14 MED ORDER — HYDROMORPHONE HCL 1 MG/ML IJ SOLN
0.2500 mg | INTRAMUSCULAR | Status: DC | PRN
Start: 2016-03-14 — End: 2016-03-14

## 2016-03-14 MED ORDER — BELLADONNA ALKALOIDS-OPIUM 16.2-60 MG RE SUPP
RECTAL | Status: AC
  Filled 2016-03-14: qty 1

## 2016-03-14 MED ORDER — LACTATED RINGERS IV SOLN
INTRAVENOUS | Status: DC | PRN
  Administered 2016-03-14 (×2): via INTRAVENOUS

## 2016-03-14 MED ORDER — PROMETHAZINE HCL 25 MG/ML IJ SOLN: 6.2500 mg | INTRAMUSCULAR | Status: DC | PRN

## 2016-03-14 MED ORDER — SODIUM CHLORIDE 0.9 % IR SOLN
Status: DC | PRN
  Administered 2016-03-14: 4000 mL

## 2016-03-14 MED ORDER — BELLADONNA ALKALOIDS-OPIUM 16.2-60 MG RE SUPP
RECTAL | Status: DC | PRN
  Administered 2016-03-14: 1 via RECTAL

## 2016-03-14 MED ORDER — CEFAZOLIN SODIUM-DEXTROSE 2-4 GM/100ML-% IV SOLN
INTRAVENOUS | Status: AC
  Filled 2016-03-14: qty 100

## 2016-03-14 MED ORDER — LIDOCAINE 2% (20 MG/ML) 5 ML SYRINGE
INTRAMUSCULAR | Status: DC | PRN
  Administered 2016-03-14: 100 mg via INTRAVENOUS

## 2016-03-14 MED ORDER — PHENAZOPYRIDINE HCL 200 MG PO TABS: 200.0000 mg | ORAL_TABLET | Freq: Three times a day (TID) | ORAL | Status: DC | PRN

## 2016-03-14 SURGICAL SUPPLY — 21 items
BAG URO CATCHER STRL LF (MISCELLANEOUS) ×3 IMPLANT
BASKET DAKOTA 1.9FR 11X120 (BASKET) IMPLANT
BASKET ZERO TIP NITINOL 2.4FR (BASKET) IMPLANT
CATH URET 5FR 28IN OPEN ENDED (CATHETERS) ×3 IMPLANT
CLOTH BEACON ORANGE TIMEOUT ST (SAFETY) ×3 IMPLANT
EXTRACTOR STONE NITINOL NGAGE (UROLOGICAL SUPPLIES) ×3 IMPLANT
FIBER LASER FLEXIVA 1000 (UROLOGICAL SUPPLIES) IMPLANT
FIBER LASER FLEXIVA 365 (UROLOGICAL SUPPLIES) IMPLANT
FIBER LASER FLEXIVA 550 (UROLOGICAL SUPPLIES) IMPLANT
FIBER LASER TRAC TIP (UROLOGICAL SUPPLIES) ×3 IMPLANT
GLOVE BIOGEL M STRL SZ7.5 (GLOVE) ×3 IMPLANT
GOWN STRL REUS W/TWL XL LVL3 (GOWN DISPOSABLE) ×3 IMPLANT
GUIDEWIRE ANG ZIPWIRE 038X150 (WIRE) IMPLANT
GUIDEWIRE STR DUAL SENSOR (WIRE) ×3 IMPLANT
MANIFOLD NEPTUNE II (INSTRUMENTS) ×3 IMPLANT
PACK CYSTO (CUSTOM PROCEDURE TRAY) ×3 IMPLANT
SHEATH ACCESS URETERAL 24CM (SHEATH) IMPLANT
SHEATH ACCESS URETERAL 38CM (SHEATH) ×3 IMPLANT
SHEATH ACCESS URETERAL 54CM (SHEATH) IMPLANT
STENT CONTOUR 6FRX28X.038 (STENTS) ×3 IMPLANT
TUBING CONNECTING 10 (TUBING) ×3 IMPLANT

## 2016-03-14 NOTE — Anesthesia Preprocedure Evaluation (Signed)
Anesthesia Evaluation  Patient identified by MRN, date of birth, ID band Patient awake    Reviewed: Allergy & Precautions, NPO status , Patient's Chart, lab work & pertinent test results  History of Anesthesia Complications Negative for: history of anesthetic complications  Airway Mallampati: II  TM Distance: >3 FB Neck ROM: Full    Dental  (+) Teeth Intact, Dental Advisory Given   Pulmonary neg pulmonary ROS,    Pulmonary exam normal        Cardiovascular hypertension, Pt. on medications negative cardio ROS Normal cardiovascular exam     Neuro/Psych  Headaches, PSYCHIATRIC DISORDERS Anxiety Depression negative neurological ROS     GI/Hepatic negative GI ROS, Neg liver ROS,   Endo/Other  negative endocrine ROS  Renal/GU   negative genitourinary   Musculoskeletal negative musculoskeletal ROS (+)   Abdominal   Peds negative pediatric ROS (+)  Hematology negative hematology ROS (+)   Anesthesia Other Findings   Reproductive/Obstetrics negative OB ROS                             Anesthesia Physical Anesthesia Plan  ASA: II  Anesthesia Plan: General   Post-op Pain Management:    Induction: Intravenous  Airway Management Planned: LMA  Additional Equipment:   Intra-op Plan:   Post-operative Plan: Extubation in OR  Informed Consent: I have reviewed the patients History and Physical, chart, labs and discussed the procedure including the risks, benefits and alternatives for the proposed anesthesia with the patient or authorized representative who has indicated his/her understanding and acceptance.   Dental advisory given  Plan Discussed with: Anesthesiologist  Anesthesia Plan Comments:         Anesthesia Quick Evaluation

## 2016-03-14 NOTE — Interval H&P Note (Signed)
History and Physical Interval Note: S/p failed ESWL for right sided ureteral stone.  After careful consideration we have collectively opted to proceed with ureteroscopy,laser lithotripsy and stent placement.  Risk and benefits of the procedure have been discussed the patient and he is fully abreast of the risk/benefits and expectations.   03/14/2016 6:01 AM  Corey Gregory  has presented today for surgery, with the diagnosis of RIGHT URETERAL CALCULI  The various methods of treatment have been discussed with the patient and family. After consideration of risks, benefits and other options for treatment, the patient has consented to  Procedure(s): CYSTOSCOPY WITH RIGHT RETROGRADE PYELOGRAM, URETEROSCOPY, LASER, BASKET EXTRACTION  AND STENT PLACEMENT (Right) HOLMIUM LASER APPLICATION (Right) as a surgical intervention .  The patient's history has been reviewed, patient examined, no change in status, stable for surgery.  I have reviewed the patient's chart and labs.  Questions were answered to the patient's satisfaction.     Louis Meckel W

## 2016-03-14 NOTE — Anesthesia Postprocedure Evaluation (Signed)
Anesthesia Post Note  Patient: Corey Gregory  Procedure(s) Performed: Procedure(s) (LRB): CYSTOSCOPY WITH RIGHT RETROGRADE PYELOGRAM, URETEROSCOPY, LASER, BASKET EXTRACTION  AND STENT PLACEMENT (Right) HOLMIUM LASER APPLICATION (Right)  Patient location during evaluation: PACU Anesthesia Type: General Level of consciousness: sedated Pain management: pain level controlled Vital Signs Assessment: post-procedure vital signs reviewed and stable Respiratory status: spontaneous breathing and respiratory function stable Cardiovascular status: stable Anesthetic complications: no    Last Vitals:  Filed Vitals:   03/14/16 1000 03/14/16 1015  BP: 121/72 123/69  Pulse: 87   Temp:  36.8 C  Resp: 11     Last Pain:  Filed Vitals:   03/14/16 1016  PainSc: 3                  Journi Moffa DANIEL

## 2016-03-14 NOTE — Transfer of Care (Signed)
Immediate Anesthesia Transfer of Care Note  Patient: Corey Gregory  Procedure(s) Performed: Procedure(s): CYSTOSCOPY WITH RIGHT RETROGRADE PYELOGRAM, URETEROSCOPY, LASER, BASKET EXTRACTION  AND STENT PLACEMENT (Right) HOLMIUM LASER APPLICATION (Right)  Patient Location: PACU  Anesthesia Type:General  Level of Consciousness:  sedated, patient cooperative and responds to stimulation  Airway & Oxygen Therapy:Patient Spontanous Breathing and Patient connected to face mask oxgen  Post-op Assessment:  Report given to PACU RN and Post -op Vital signs reviewed and stable  Post vital signs:  Reviewed and stable  Last Vitals:  Filed Vitals:   03/14/16 0551  BP: 139/82  Pulse: 73  Temp: 36.6 C  Resp: 18    Complications: No apparent anesthesia complications

## 2016-03-14 NOTE — Op Note (Signed)
Preoperative diagnosis:  1. Right UPJ stone   Postoperative diagnosis:  1. Same   Procedure: 1. Cystoscopy, right retrograde pyelogram with interpretation, right ureteroscopy and laser lithotripsy/stone removal, right ureteral stent placement  Surgeon: Ardis Hughs, MD  Anesthesia: General  Complications: None  Intraoperative findings: The right retrograde pyelogram was performed using 10 mL of [place] contrast. This demonstrated a normal caliber ureter up to the level of the UPJ where there was a large filling defect consistent with the patient's known stone. The patient did have some proximal hydronephrosis. There are no additional filling defects.  EBL: Minimal  Specimens: Kidney stone, this will be sent to like urology pathology for further stone characterization.  Indication: Corey Gregory is a 45 y.o. patient with history of a right-sided UPJ stone. The patient underwent shockwave lithotripsy several weeks ago which failed to fragment the stone completely. As such, the patient and I discussed completion ureteroscopy as a staged approach for his management of a large UPJ stone..  After reviewing the management options for treatment, he elected to proceed with the above surgical procedure(s). We have discussed the potential benefits and risks of the procedure, side effects of the proposed treatment, the likelihood of the patient achieving the goals of the procedure, and any potential problems that might occur during the procedure or recuperation. Informed consent has been obtained.  Description of procedure:  The patient was taken to the operating room and general anesthesia was induced.  The patient was placed in the dorsal lithotomy position, prepped and draped in the usual sterile fashion, and preoperative antibiotics were administered. A preoperative time-out was performed.   A 21 French 30 cystoscope with jelly passed into the patient's urethra and into the bladder. A 3 and  60 cystoscopic evaluation was performed noting no mucosal abnormalities, bladder tumors, or stones. The ureteral orifices were orthotopic.  I subsequently cannulated the right ureteral orifice with a 5 [place] open-ended ureteral catheter and injected 10 mL on the free contrast with the above findings. I then advanced a 0.038 sensor wire through the ureteral catheter and up into the right kidney. The catheter was then removed and the wire and the cystoscope then backed out gently.  I then advanced a 4/6 French semirigid ureteroscope to the patient's urethra into the bladder under video guidance. I was able to cannulate the right ureteral orifice atraumatically and advanced it up to the stone. There were some fragments that I was able to remove without fragmentation. However, I then opted to perform laser lithotripsy in order to more easily extract the large UPJ stone. In the process of fragmenting the stone using a 200  fiber with settings of point 6 J and 6 Hz the stone retropulsed into the renal pelvis. At this point I advanced a second wire through the semirigid scope and back scope out gently.   I then advanced a 12/14 [place] times 38 cm ureteral access sheath into the patient's distal ureter. Removing the inner sheath was able to advance the flexible scope through the sheath and into the proximal ureter. I then advanced into the kidney without issue. The stone was then removed using a 0 tipped basket into the upper pole. I then lasered the stone into small fragments using the same 200  fiber with the same settings of 6 Hz and point 6 J. I then removed the fragments with a in gauge basket systematically. The kidney was then reinspected after injecting contrast into the renal pelvis and using fluoroscopic  assistance to ensure that all large fragments had been removed. There was some dust that was too small to be removed from the kidney with the basket. I then gently backed the scope out removing the  sheath simultaneously to ensure that there was no significant ureteral trauma.  I then backloaded the 0.038 sensor wire over the rigid cystoscope and then reintroduce the cystoscope into the patient's bladder gently. I then advanced a 6 [place] times 28 cm double-J ureteral stent over the wire into the right ureter. I was able then to advance the stent over the wire oozing fluoroscopic guidance until the tip was noted to be in the renal pelvis. Then gently backed out the wire and advanced the stent noting a nice curl in the renal pelvis. I then pulled the scope back to the bladder neck and advanced the stent to stent was evident at the bladder neck removing the wire completely and ensuring that there was a nice curl within the bladder. I subsequently emptied the patient's bladder and removed all the residual stone fragments.  The stent tether was then secured to the dorsal aspect of the patient's penis. The patient subsequent extubated and returned to the PACU in stable condition.  Disposition: The patient is instructed to remove the stent in 5 days. He will follow-up in clinic in 6 weeks with a renal ultrasound.    Ardis Hughs, M.D.

## 2016-03-14 NOTE — Anesthesia Procedure Notes (Signed)
Procedure Name: LMA Insertion Date/Time: 03/14/2016 7:33 AM Performed by: Talbot Grumbling Pre-anesthesia Checklist: Patient identified, Emergency Drugs available, Suction available and Patient being monitored Patient Re-evaluated:Patient Re-evaluated prior to inductionOxygen Delivery Method: Circle system utilized Preoxygenation: Pre-oxygenation with 100% oxygen Intubation Type: IV induction Ventilation: Mask ventilation without difficulty LMA: LMA inserted LMA Size: 5.0 Number of attempts: 1 Placement Confirmation: positive ETCO2 and breath sounds checked- equal and bilateral Tube secured with: Tape Dental Injury: Teeth and Oropharynx as per pre-operative assessment

## 2016-03-14 NOTE — Discharge Instructions (Signed)
DISCHARGE INSTRUCTIONS FOR KIDNEY STONE/URETERAL STENT   MEDICATIONS:  1.  Resume all your other meds from home - except do not take any extra narcotic pain meds that you may have at home.  2. Pyridium is to help with the burning/stinging when you urinate. 3. Tramadol is for moderate/severe pain, otherwise taking upto 1000 mg every 6 hours of plainTylenol will help treat your pain.   4. Take Cipro one hour prior to removal of your stent.   ACTIVITY:  1. No strenuous activity x 1week  2. No driving while on narcotic pain medications  3. Drink plenty of water  4. Continue to walk at home - you can still get blood clots when you are at home, so keep active, but don't over do it.  5. May return to work/school tomorrow or when you feel ready   BATHING:  1. You can shower and we recommend daily showers  2. You have a string coming from your urethra: The stent string is attached to your ureteral stent. Do not pull on this.   SIGNS/SYMPTOMS TO CALL:  Please call us if you have a fever greater than 101.5, uncontrolled nausea/vomiting, uncontrolled pain, dizziness, unable to urinate, bloody urine, chest pain, shortness of breath, leg swelling, leg pain, redness around wound, drainage from wound, or any other concerns or questions.   You can reach [place] at [phone].   FOLLOW-UP:  1. You have an appointment in 6 weeks with a ultrasound of your kidneys prior.   2. You have a string attached to your stent, you may remove it on Wednesday July 19. To do this, pull the strings until the stents are completely removed. You may feel an odd sensation in your back.      General Anesthesia, Adult, Care After Refer to this sheet in the next few weeks. These instructions provide you with information on caring for yourself after your procedure. Your health care provider may also give you more specific instructions. Your treatment has been planned according to current medical practices, but problems sometimes  occur. Call your health care provider if you have any problems or questions after your procedure. WHAT TO EXPECT AFTER THE PROCEDURE After the procedure, it is typical to experience:  Sleepiness.  Nausea and vomiting. HOME CARE INSTRUCTIONS  For the first 24 hours after general anesthesia:  Have a responsible person with you.  Do not drive a car. If you are alone, do not take public transportation.  Do not drink alcohol.  Do not take medicine that has not been prescribed by your health care provider.  Do not sign important papers or make important decisions.  You may resume a normal diet and activities as directed by your health care provider.  Change bandages (dressings) as directed.  If you have questions or problems that seem related to general anesthesia, call the hospital and ask for the anesthetist or anesthesiologist on call. SEEK MEDICAL CARE IF:  You have nausea and vomiting that continue the day after anesthesia.  You develop a rash. SEEK IMMEDIATE MEDICAL CARE IF:   You have difficulty breathing.  You have chest pain.  You have any allergic problems.   This information is not intended to replace advice given to you by your health care provider. Make sure you discuss any questions you have with your health care provider.   Document Released: 11/24/2000 Document Revised: 09/08/2014 Document Reviewed: 12/17/2011 Elsevier Interactive Patient Education [employer].

## 2016-03-14 NOTE — H&P (Signed)
Reason For Visit Right-sided kidney stones   History of Present Illness 45 year old male who was seen in follow-up from the emergency department where he was seen on 01/07/16 for progressively worsening right-sided abdominal pain and flank pain. The patient states that his symptoms began 3 days prior to presentation. He describes the pain as a very sharp intense pain in the right costal vertebral angle/flank. The pain is worse with palpation. It radiates across to the front. He has had associated nausea and vomiting. He denies any voiding symptoms. The pain has been constant but relieved with narcotic pain medications. He denies any fevers or chills. The patient has no history of kidney stones. In the emergency department the patient underwent a CT scan which demonstrated a 12-13 mm stone at the right UPJ with associated hydronephrosis. His pain was relatively well-controlled and there was no evidence of acute kidney injury or infection. Subsequently was discharged home with pain control. In the interval the patient has had ongoing pain but this is being controlled with oxycodone. Without the pain medications the patient is unbearable.   Past Medical History Problems  1. History of Anxiety (F41.9) 2. History of depression (Z86.59) 3. History of hypertension (Z86.79)  Surgical History Problems  1. History of No Surgical Problems  Current Meds 1. Butorphanol Tartrate 10 MG/ML Nasal Solution; Therapy: (Recorded:09May2017) to Recorded 2. Lisinopril TABS; Therapy: (Recorded:09May2017) to Recorded 3. Paxil 20 MG Oral Tablet (PARoxetine HCl); Therapy: (Recorded:09May2017) to Recorded 4. Xanax TABS (ALPRAZolam); Therapy: (Recorded:09May2017) to Recorded  Allergies Medication  1. No Known Drug Allergies  Family History Problems  1. Family history of kidney stones (Z84.1) : Father 2. Family history of malignant neoplasm of prostate [id number] : Father  Social History Problems    Denied: History of Alcohol use  Denied: History of Caffeine use  Married  Never a smoker  Number of children  1 son  Occupation  Animator  Review of Systems Genitourinary, constitutional, skin, eye, otolaryngeal, hematologic/lymphatic, cardiovascular, pulmonary, endocrine, musculoskeletal, gastrointestinal, neurological and psychiatric system(s) were reviewed and pertinent findings if present are noted and are otherwise negative.  Genitourinary: urinary frequencyandnocturia.  Gastrointestinal: nausea,vomitingandconstipation.  Constitutional: fever,night sweatsandfeeling tired (fatigue).   Vitals Vital Signs [Data Includes: Last 1 Day]  Recorded: [id number] 12:46PM  Height: 6 ft 3 in Weight: 250 lb  BMI Calculated: 31.25 BSA Calculated: 2.41 Blood Pressure: 145 / 88 Temperature: 97.9 F Heart Rate: 74  Physical Exam Constitutional: Well nourishedandwell developed. In acute distress.  ENT:. The ears and nose are normal in appearance.  Neck: The appearance of the neck is normalandno neck mass is present.  Pulmonary: No respiratory distressandnormal respiratory rhythm and effort.  Cardiovascular: Heart rate and rhythm are normal. No peripheral edema.  Abdomen: The abdomen is soft and nontender. No masses are palpated. Right CVA tenderness. No hernias are palpable. No hepatosplenomegaly noted.  Rectal: Rectal exam demonstrates normal sphincter tone,no tendernessandno masses. The prostate has no nodularityandis not tender. The left seminal vesicle is nonpalpable. The right seminal vesicle is nonpalpable. The perineum is normal on inspection.  Genitourinary: Examination of the penis demonstrates no discharge,no masses,no lesionsanda normal meatus. The scrotum is without lesions. The right epididymis is palpably normalandnon-tender. The left epididymis is palpably normalandnon-tender. The right testis is non-tenderandwithout masses. The left  testis is non-tenderandwithout masses.  Lymphatics: The femoral and inguinal nodes are not enlarged or tender.  Skin: Normal skin turgor,no visible rashandno visible skin lesions.  Neuro/Psych:. Mood and affect are appropriate.   Results/Data Urine [  Data Includes: Last 1 Day]   [id number]  COLOR YELLOW   APPEARANCE CLEAR   SPECIFIC GRAVITY 1.025   pH 5.5   GLUCOSE NEGATIVE   BILIRUBIN NEGATIVE   KETONE NEGATIVE   BLOOD 2+   PROTEIN NEGATIVE   NITRITE NEGATIVE   LEUKOCYTE ESTERASE NEGATIVE   SQUAMOUS EPITHELIAL/HPF 0-5 HPF  WBC NONE SEEN WBC/HPF  RBC 3-10 RBC/HPF  BACTERIA NONE SEEN HPF  CRYSTALS NONE SEEN HPF  CASTS NONE SEEN LPF  Yeast NONE SEEN HPF   I've independently reviewed the patient's CT scan as well as read the results.  Urinalysis today demonstrates microscopic hematuria without evidence of infection.   Assessment Assessed  1. Right ureteral calculus (N20.1)  Plan Health Maintenance  1. UA With REFLEX; [Do Not Release]; Status:Complete; Done: [id number] 12:22PM Right ureteral calculus  2. Start: OxyCODONE HCl - 5 MG Oral Tablet; TAKE 1 TO 2 TABLETS EVERY 4 HOURS AS NEEDED FOR PAIN 3. Follow-up Schedule Surgery Office Follow-up Status: Hold For - Appointment  Requested for: [id number]  Discussion/Summary The patient has a large proximal right ureteral stone. We went over the treatment options with the patient including urgent stent placement given his poorly controlled pain today. Also discussed scheduled ureteroscopy over the next day or 2. And finally we discussed shockwave lithotripsy. I went over all of these options with the patient in significant detail. Ultimately, the patient has opted to proceed with shockwave lithotripsy. I spoke with the patient about large volume of stone and the chance that he may require additional procedures due to poor fragmentation. We also briefly discussed the concept of  perinephric hematoma. However after detailing the risks and the benefits of the operation the patient would like to proceed. We'll try to get him scheduled in the coming days for the next shockwave lithotripsy appointment.

## 2016-03-21 ENCOUNTER — Encounter (HOSPITAL_COMMUNITY): Payer: Self-pay | Admitting: Emergency Medicine

## 2016-03-21 ENCOUNTER — Observation Stay (HOSPITAL_COMMUNITY)
Admission: EM | Admit: 2016-03-21 | Discharge: 2016-03-22 | Disposition: A | Discharge status: Home / Self Care | Payer: BLUE CROSS/BLUE SHIELD | Attending: Family Medicine | Admitting: Family Medicine

## 2016-03-21 ENCOUNTER — Emergency Department (HOSPITAL_COMMUNITY): Payer: BLUE CROSS/BLUE SHIELD

## 2016-03-21 DIAGNOSIS — N136 Pyonephrosis: Secondary | ICD-10-CM | POA: Diagnosis not present

## 2016-03-21 DIAGNOSIS — I10 Essential (primary) hypertension: Secondary | ICD-10-CM | POA: Diagnosis present

## 2016-03-21 DIAGNOSIS — I129 Hypertensive chronic kidney disease with stage 1 through stage 4 chronic kidney disease, or unspecified chronic kidney disease: Secondary | ICD-10-CM | POA: Diagnosis not present

## 2016-03-21 DIAGNOSIS — N201 Calculus of ureter: Secondary | ICD-10-CM

## 2016-03-21 DIAGNOSIS — N10 Acute tubulo-interstitial nephritis: Secondary | ICD-10-CM | POA: Diagnosis not present

## 2016-03-21 DIAGNOSIS — R1084 Generalized abdominal pain: Secondary | ICD-10-CM | POA: Diagnosis not present

## 2016-03-21 DIAGNOSIS — N132 Hydronephrosis with renal and ureteral calculous obstruction: Secondary | ICD-10-CM | POA: Diagnosis not present

## 2016-03-21 DIAGNOSIS — Z79899 Other long term (current) drug therapy: Secondary | ICD-10-CM | POA: Insufficient documentation

## 2016-03-21 DIAGNOSIS — N189 Chronic kidney disease, unspecified: Secondary | ICD-10-CM | POA: Insufficient documentation

## 2016-03-21 DIAGNOSIS — R7989 Other specified abnormal findings of blood chemistry: Secondary | ICD-10-CM

## 2016-03-21 DIAGNOSIS — F329 Major depressive disorder, single episode, unspecified: Secondary | ICD-10-CM | POA: Insufficient documentation

## 2016-03-21 DIAGNOSIS — Z87442 Personal history of urinary calculi: Secondary | ICD-10-CM | POA: Diagnosis not present

## 2016-03-21 DIAGNOSIS — F419 Anxiety disorder, unspecified: Secondary | ICD-10-CM | POA: Insufficient documentation

## 2016-03-21 DIAGNOSIS — E860 Dehydration: Secondary | ICD-10-CM | POA: Diagnosis not present

## 2016-03-21 DIAGNOSIS — N200 Calculus of kidney: Secondary | ICD-10-CM

## 2016-03-21 LAB — COMPREHENSIVE METABOLIC PANEL
ALT: 14 U/L — ABNORMAL LOW (ref 17–63)
AST: 21 U/L (ref 15–41)
Albumin: 5 g/dL (ref 3.5–5.0)
Alkaline Phosphatase: 88 U/L (ref 38–126)
Anion gap: 11 (ref 5–15)
BUN: 15 mg/dL (ref 6–20)
CO2: 27 mmol/L (ref 22–32)
Calcium: 9.8 mg/dL (ref 8.9–10.3)
Chloride: 100 mmol/L — ABNORMAL LOW (ref 101–111)
Creatinine, Ser: 1.43 mg/dL — ABNORMAL HIGH (ref 0.61–1.24)
GFR calc Af Amer: >60 mL/min (ref >60)
GFR calc non Af Amer: 57 mL/min — ABNORMAL LOW (ref >60)
Glucose, Bld: 138 mg/dL — ABNORMAL HIGH (ref 65–99)
Potassium: 3.5 mmol/L (ref 3.5–5.1)
Sodium: 138 mmol/L (ref 135–145)
Total Bilirubin: 1.2 mg/dL (ref 0.3–1.2)
Total Protein: 7.9 g/dL (ref 6.5–8.1)

## 2016-03-21 LAB — CBC
HCT: 42.3 % (ref 39.0–52.0)
Hemoglobin: 15 g/dL (ref 13.0–17.0)
MCH: 30.7 pg (ref 26.0–34.0)
MCHC: 35.5 g/dL (ref 30.0–36.0)
MCV: 86.7 fL (ref 78.0–100.0)
Platelets: 263 10*3/uL (ref 150–400)
RBC: 4.88 MIL/uL (ref 4.22–5.81)
RDW: 12.3 % (ref 11.5–15.5)
WBC: 16.3 10*3/uL — ABNORMAL HIGH (ref 4.0–10.5)

## 2016-03-21 LAB — URINALYSIS, ROUTINE W REFLEX MICROSCOPIC
Glucose, UA: NEGATIVE mg/dL
Ketones, ur: 40 mg/dL — AB
Nitrite: POSITIVE — AB
Protein, ur: >300 mg/dL — AB
Specific Gravity, Urine: 1.025 (ref 1.005–1.030)
pH: 5 (ref 5.0–8.0)

## 2016-03-21 LAB — URINE MICROSCOPIC-ADD ON

## 2016-03-21 LAB — I-STAT CG4 LACTIC ACID, ED: Lactic Acid, Venous: 0.84 mmol/L (ref 0.5–1.9)

## 2016-03-21 LAB — LIPASE, BLOOD: Lipase: 71 U/L — ABNORMAL HIGH (ref 11–51)

## 2016-03-21 MED ORDER — TAMSULOSIN HCL 0.4 MG PO CAPS
0.4000 mg | ORAL_CAPSULE | Freq: Every day | ORAL | Status: DC
  Administered 2016-03-21: 0.4 mg via ORAL
  Filled 2016-03-21: qty 1

## 2016-03-21 MED ORDER — IOPAMIDOL (ISOVUE-300) INJECTION 61%
100.0000 mL | Freq: Once | INTRAVENOUS | Status: AC | PRN
  Administered 2016-03-21: 100 mL via INTRAVENOUS

## 2016-03-21 MED ORDER — MORPHINE SULFATE (PF) 4 MG/ML IV SOLN
6.0000 mg | Freq: Once | INTRAVENOUS | Status: AC
  Administered 2016-03-21: 6 mg via INTRAVENOUS
  Filled 2016-03-21: qty 2

## 2016-03-21 MED ORDER — ONDANSETRON HCL 4 MG/2ML IJ SOLN
4.0000 mg | Freq: Once | INTRAMUSCULAR | Status: AC | PRN
  Administered 2016-03-21: 4 mg via INTRAVENOUS
  Filled 2016-03-21: qty 2

## 2016-03-21 MED ORDER — ONDANSETRON HCL 4 MG/2ML IJ SOLN
4.0000 mg | Freq: Once | INTRAMUSCULAR | Status: AC
  Administered 2016-03-21: 4 mg via INTRAVENOUS
  Filled 2016-03-21: qty 2

## 2016-03-21 MED ORDER — DEXTROSE 5 % IV SOLN
1.0000 g | Freq: Once | INTRAVENOUS | Status: AC
  Administered 2016-03-21: 1 g via INTRAVENOUS
  Filled 2016-03-21: qty 10

## 2016-03-21 MED ORDER — HYDROMORPHONE HCL 1 MG/ML IJ SOLN
1.0000 mg | Freq: Once | INTRAMUSCULAR | Status: AC
Start: 2016-03-21 — End: 2016-03-21
  Administered 2016-03-21: 1 mg via INTRAVENOUS
  Filled 2016-03-21: qty 1

## 2016-03-21 MED ORDER — METOCLOPRAMIDE HCL 5 MG/ML IJ SOLN
10.0000 mg | Freq: Once | INTRAMUSCULAR | Status: AC
  Administered 2016-03-21: 10 mg via INTRAVENOUS
  Filled 2016-03-21: qty 2

## 2016-03-21 MED ORDER — SODIUM CHLORIDE 0.9 % IV BOLUS (SEPSIS)
1000.0000 mL | Freq: Once | INTRAVENOUS | Status: AC
  Administered 2016-03-21: 1000 mL via INTRAVENOUS

## 2016-03-21 NOTE — ED Provider Notes (Signed)
CSN: [id number]     Arrival date & time 03/21/16  1811 History   First MD Initiated Contact with Patient 03/21/16 1822     Chief Complaint  Patient presents with  . Nausea  . Emesis     (Consider location/radiation/quality/duration/timing/severity/associated sxs/prior Treatment) HPI   45 year old male With history of chronic kidney disease, anxiety, hypertension presenting today with complaint of nausea and vomiting. Patient had a renal stent placement last week as treatment for kidney stones on the right kidney. He felt fine after the surgery. 2 days ago he was instructed to remove the stent at home. Upon removal, patient felt fine and then he began to take ciprofloxacin. A few hours later patient states he felt nauseous, feeling terrible, and since then he has had persistent nausea and vomiting. Today he has a couple to 6 episodes of nonbloody nonbilious vomit. Endorse chills, subjective fever, and very nauseous. Aside from mild urinary discomfort he denies any significant headache, chest pain, shortness of breath, abdominal pain, back pain, hematuria. He is currently  taking Pyridium which turns his urine is dark color. He only took Cipro once 2 days ago. He denies any significant penile pain or testicular pain.  Past Medical History  Diagnosis Date  . Chronic kidney disease 2017  . Hypertension 2007  . Anxiety   . Depression   . Headache    Past Surgical History  Procedure Laterality Date  . Wisdom tooth extraction  1998  . Ganglion cyst excision Right 2005  . Lithotripsy    . Cystoscopy with retrograde pyelogram, ureteroscopy and stent placement Right 03/14/2016    Procedure: CYSTOSCOPY WITH RIGHT RETROGRADE PYELOGRAM, URETEROSCOPY, LASER, BASKET EXTRACTION  AND STENT PLACEMENT;  Surgeon: Ardis Hughs, MD;  Location: [facility];  Service: Urology;  Laterality: Right;  . Holmium laser application Right [id number]    Procedure: HOLMIUM LASER APPLICATION;  Surgeon: Ardis Hughs, MD;  Location: [facility];  Service: Urology;  Laterality: Right;   Family History  Problem Relation Age of Onset  . Kidney Stones Father   . Prostate cancer Father    Social History  Substance Use Topics  . Smoking status: Never Smoker   . Smokeless tobacco: Never Used  . Alcohol Use: No    Review of Systems  All other systems reviewed and are negative.     Allergies  Review of patient's allergies indicates no known allergies.  Home Medications   Prior to Admission medications   Medication Sig Start Date End Date Taking? Authorizing Provider  ALPRAZolam (XANAX) 0.25 MG tablet Take 0.25 mg by mouth 2 (two) times daily as needed. Anxiety 12/26/15   Historical Provider, MD  butorphanol (STADOL) 10 MG/ML nasal spray Place 1 spray into the nose 2 (two) times daily as needed for headache.  12/23/15   Historical Provider, MD  ciprofloxacin (CIPRO) 500 MG tablet Take 1 tablet (500 mg total) by mouth once. 03/14/16   Ardis Hughs, MD  lisinopril-hydrochlorothiazide (PRINZIDE,ZESTORETIC) 20-12.5 MG tablet Take 1 tablet by mouth every evening.     Historical Provider, MD  PARoxetine (PAXIL) 20 MG tablet Take 20 mg by mouth every evening.  10/08/15   Historical Provider, MD  phenazopyridine (PYRIDIUM) 200 MG tablet Take 1 tablet (200 mg total) by mouth 3 (three) times daily as needed for pain. 03/14/16   Ardis Hughs, MD  traMADol (ULTRAM) 50 MG tablet Take 1-2 tablets (50-100 mg total) by mouth every 6 (six) hours as needed  for moderate pain. 03/14/16   Ardis Hughs, MD   There were no vitals taken for this visit. Physical Exam  Constitutional: He appears well-developed and well-nourished. No distress.  HENT:  Head: Atraumatic.  Eyes: Conjunctivae are normal.  Neck: Neck supple.  Cardiovascular: Normal rate and regular rhythm.   Pulmonary/Chest: Effort normal and breath sounds normal.  Abdominal: Soft. There is no tenderness.  Mild right CVA tenderness.   Genitourinary:  Chaperone present during exam. Normal penile shaft, no testicular tenderness or swelling noted. Normal scrotum. No inguinal lymphadenopathy or inguinal hernia noted. Perineum soft  Neurological: He is alert.  Skin: No rash noted.  Psychiatric: He has a normal mood and affect.  Nursing note and vitals reviewed.   ED Course  Procedures (including critical care time) Labs Review Labs Reviewed  LIPASE, BLOOD - Abnormal; Notable for the following:    Lipase 71 (*)    All other components within normal limits  COMPREHENSIVE METABOLIC PANEL - Abnormal; Notable for the following:    Chloride 100 (*)    Glucose, Bld 138 (*)    Creatinine, Ser 1.43 (*)    ALT 14 (*)    GFR calc non Af Amer 57 (*)    All other components within normal limits  CBC - Abnormal; Notable for the following:    WBC 16.3 (*)    All other components within normal limits  URINALYSIS, ROUTINE W REFLEX MICROSCOPIC (NOT AT [facility]) - Abnormal; Notable for the following:    Color, Urine RED (*)    APPearance TURBID (*)    Hgb urine dipstick LARGE (*)    Bilirubin Urine MODERATE (*)    Ketones, ur 40 (*)    Protein, ur >300 (*)    Nitrite POSITIVE (*)    Leukocytes, UA MODERATE (*)    All other components within normal limits  URINE MICROSCOPIC-ADD ON - Abnormal; Notable for the following:    Squamous Epithelial / LPF 0-5 (*)    Bacteria, UA FEW (*)    All other components within normal limits  URINE CULTURE  I-STAT CG4 LACTIC ACID, ED    Imaging Review Ct Abdomen Pelvis W Contrast  03/21/2016  CLINICAL DATA:  Acute abdominal pain, nausea and vomiting, history of nephrolithiasis. EXAM: CT ABDOMEN AND PELVIS WITH CONTRAST TECHNIQUE: Multidetector CT imaging of the abdomen and pelvis was performed using the standard protocol following bolus administration of intravenous contrast. CONTRAST:  124mL ISOVUE-300 IOPAMIDOL (ISOVUE-300) INJECTION 61% COMPARISON:  01/07/2016 FINDINGS: Lower chest:  No acute  findings. Hepatobiliary: No masses or other significant abnormality. Pancreas: No mass, inflammatory changes, or other significant abnormality. Spleen: Within normal limits in size. Stable 10 mm hypodensity in the spleen centrally, image 21 suspect small cyst. Accessory splenule noted. Adrenals/Urinary Tract: Normal adrenal glands. Right kidney demonstrates acute moderate hydro nephrosis and associated right hydroureter. This extends into the pelvis. At the right UVJ, there is an obstructing calculus along the bladder wall measuring 6 mm. Ureteral enhancement as well as periureteral stranding edema noted on the right. Additional punctate nonobstructing right lower pole renal calculi. Left kidney demonstrates large central parapelvic cysts as before without hydronephrosis or obstructing ureteral calculus. Left ureter is decompressed. Tiny punctate nonobstructing left lower pole intrarenal calculus noted. Urinary bladder demonstrates diffuse wall thickening, asymmetric on the right this may related to underdistention. Difficult to exclude cystitis. Stomach/Bowel: Negative for bowel obstruction, significant dilatation, ileus, or free air. Normal appendix demonstrated. No fluid collection or abscess. Vascular/Lymphatic: No  adenopathy. Minor left iliac atherosclerosis. Negative for aneurysm. Reproductive: Prominent but symmetric seminal vesicles, nonspecific. Other: No inguinal abnormality or hernia.  Intact abdominal wall. Musculoskeletal: Minor degenerative changes of the lumbar spine, most pronounced at L5-S1 with a posterior calcified disc complex noted, coronal image image 98. IMPRESSION: Acute moderately obstructing 6 mm right UVJ calculus with right hydroureteronephrosis. Additional incidental punctate nonobstructing lower pole intrarenal calculi bilaterally. Stable large left parapelvic cysts. Electronically Signed   By: Jerilynn Mages.  Shick M.D.   On: 03/21/2016 20:01   I have personally reviewed and evaluated these  images and lab results as part of my medical decision-making.   EKG Interpretation None      MDM   Final diagnoses:  Kidney stone on right side  Acute pyelonephritis    BP 144/89 mmHg  Pulse 78  Temp(Src) 98.8 F (37.1 C) (Oral)  Resp 16  Ht 6\' 2"  (1.88 m)  Wt 108.863 kg  BMI 30.80 kg/m2  SpO2 98%  Pt with nausea/vomiting and feeling uncomfortable.  He also report having dysuria.  Recent stenting and lithotripsy.  He is afebrile, VS.  Work up initiated.   10:44 PM  UA shows signs UTI.  Mild bump in lipase of 71.  WBC 16.3, normal lactic acid.  Cr 1.47.  Abd/pelvis CT shows acute moderately obstructing 18mm R UVJ calculus with R hydroureteronephrosis.  Pt received rocephin, urine culture sent.  IVF given.    appreciatre consultation from Urologist DR. Eskridge who request medicine admission and he will see pt tomorrow.   11:50 PM Appreciate consultation from [facility] Dr. Loleta Books who agrees to see pt in the ER.  He recommend admitting to med surg floor, observation status.    Domenic Moras, PA-C 03/21/16 2351  Gareth Morgan, MD 03/22/16 [phone]

## 2016-03-21 NOTE — Progress Notes (Signed)
Chart and imaging reviewed. Discussed with PA. Pt AFVSS. CT - stone at right UVJ almost passed into bladder. Ordered tamsulosin. Made NPO at 0400 in case pt doesn't pass stone and needs OR tomorrow. Full consult to follow.

## 2016-03-21 NOTE — ED Notes (Signed)
Bed: WA22 Expected date:  Expected time:  Means of arrival:  Comments: EMS 

## 2016-03-21 NOTE — ED Notes (Signed)
Pt had surgery for stent last week for kidney stones.  Wednesday, after removal of the stent,  he began to have N/V and feeling terrible.

## 2016-03-22 ENCOUNTER — Encounter (HOSPITAL_COMMUNITY): Payer: Self-pay | Admitting: Family Medicine

## 2016-03-22 DIAGNOSIS — N200 Calculus of kidney: Secondary | ICD-10-CM

## 2016-03-22 DIAGNOSIS — R748 Abnormal levels of other serum enzymes: Secondary | ICD-10-CM | POA: Diagnosis not present

## 2016-03-22 DIAGNOSIS — R7989 Other specified abnormal findings of blood chemistry: Secondary | ICD-10-CM

## 2016-03-22 DIAGNOSIS — I10 Essential (primary) hypertension: Secondary | ICD-10-CM

## 2016-03-22 LAB — BASIC METABOLIC PANEL
Anion gap: 8 (ref 5–15)
BUN: 14 mg/dL (ref 6–20)
CO2: 29 mmol/L (ref 22–32)
Calcium: 9.4 mg/dL (ref 8.9–10.3)
Chloride: 101 mmol/L (ref 101–111)
Creatinine, Ser: 1.23 mg/dL (ref 0.61–1.24)
GFR calc Af Amer: >60 mL/min (ref >60)
GFR calc non Af Amer: >60 mL/min (ref >60)
Glucose, Bld: 105 mg/dL — ABNORMAL HIGH (ref 65–99)
Potassium: 3.4 mmol/L — ABNORMAL LOW (ref 3.5–5.1)
Sodium: 138 mmol/L (ref 135–145)

## 2016-03-22 MED ORDER — HYDROMORPHONE HCL 1 MG/ML IJ SOLN: 1.0000 mg | INTRAMUSCULAR | Status: DC | PRN

## 2016-03-22 MED ORDER — OXYCODONE HCL 5 MG PO TABS: 5.0000 mg | ORAL_TABLET | ORAL | Status: DC | PRN

## 2016-03-22 MED ORDER — PAROXETINE HCL 20 MG PO TABS
20.0000 mg | ORAL_TABLET | Freq: Every evening | ORAL | Status: DC
  Filled 2016-03-22: qty 1

## 2016-03-22 MED ORDER — CEPHALEXIN 500 MG PO CAPS: 500.0000 mg | ORAL_CAPSULE | Freq: Three times a day (TID) | ORAL | Status: DC

## 2016-03-22 MED ORDER — ACETAMINOPHEN 325 MG PO TABS: 650.0000 mg | ORAL_TABLET | Freq: Four times a day (QID) | ORAL | Status: DC | PRN

## 2016-03-22 MED ORDER — HYDROCHLOROTHIAZIDE 12.5 MG PO CAPS
12.5000 mg | ORAL_CAPSULE | Freq: Every day | ORAL | Status: DC
  Filled 2016-03-22: qty 1

## 2016-03-22 MED ORDER — DEXTROSE 5 % IV SOLN
1.0000 g | INTRAVENOUS | Status: DC
  Filled 2016-03-22: qty 10

## 2016-03-22 MED ORDER — ONDANSETRON HCL 4 MG PO TABS: 4.0000 mg | ORAL_TABLET | Freq: Four times a day (QID) | ORAL | Status: DC | PRN

## 2016-03-22 MED ORDER — ONDANSETRON HCL 4 MG/2ML IJ SOLN: 4.0000 mg | Freq: Four times a day (QID) | INTRAMUSCULAR | Status: DC | PRN

## 2016-03-22 MED ORDER — SENNOSIDES-DOCUSATE SODIUM 8.6-50 MG PO TABS: 1.0000 | ORAL_TABLET | Freq: Every evening | ORAL | Status: DC | PRN

## 2016-03-22 MED ORDER — ACETAMINOPHEN 650 MG RE SUPP: 650.0000 mg | Freq: Four times a day (QID) | RECTAL | Status: DC | PRN

## 2016-03-22 NOTE — Progress Notes (Signed)
Notified Dr. Junious Silk that patient passed stone prior to day shift. New orders received to advance diet. Per Dr.  Junious Silk patient will be okay to discharge today.

## 2016-03-22 NOTE — Discharge Instructions (Signed)
Kidney Stones Kidney stones (urolithiasis) are deposits that form inside your kidneys. The intense pain is caused by the stone moving through the urinary tract. When the stone moves, the ureter goes into spasm around the stone. The stone is usually passed in the urine.  CAUSES   A disorder that makes certain neck glands produce too much parathyroid hormone (primary hyperparathyroidism).  A buildup of uric acid crystals, similar to gout in your joints.  Narrowing (stricture) of the ureter.  A kidney obstruction present at birth (congenital obstruction).  Previous surgery on the kidney or ureters.  Numerous kidney infections. SYMPTOMS   Feeling sick to your stomach (nauseous).  Throwing up (vomiting).  Blood in the urine (hematuria).  Pain that usually spreads (radiates) to the groin.  Frequency or urgency of urination. DIAGNOSIS   Taking a history and physical exam.  Blood or urine tests.  CT scan.  Occasionally, an examination of the inside of the urinary bladder (cystoscopy) is performed. TREATMENT   Observation.  Increasing your fluid intake.  Extracorporeal shock wave lithotripsy--This is a noninvasive procedure that uses shock waves to break up kidney stones.  Surgery may be needed if you have severe pain or persistent obstruction. There are various surgical procedures. Most of the procedures are performed with the use of small instruments. Only small incisions are needed to accommodate these instruments, so recovery time is minimized. The size, location, and chemical composition are all important variables that will determine the proper choice of action for you. Talk to your health care provider to better understand your situation so that you will minimize the risk of injury to yourself and your kidney.  HOME CARE INSTRUCTIONS   Drink enough water and fluids to keep your urine clear or pale yellow. This will help you to pass the stone or stone  fragments.  Strain all urine through the provided strainer. Keep all particulate matter and stones for your health care provider to see. The stone causing the pain may be as small as a grain of salt. It is very important to use the strainer each and every time you pass your urine. The collection of your stone will allow your health care provider to analyze it and verify that a stone has actually passed. The stone analysis will often identify what you can do to reduce the incidence of recurrences.  Only take over-the-counter or prescription medicines for pain, discomfort, or fever as directed by your health care provider.  Keep all follow-up visits as told by your health care provider. This is important.  Get follow-up X-rays if required. The absence of pain does not always mean that the stone has passed. It may have only stopped moving. If the urine remains completely obstructed, it can cause loss of kidney function or even complete destruction of the kidney. It is your responsibility to make sure X-rays and follow-ups are completed. Ultrasounds of the kidney can show blockages and the status of the kidney. Ultrasounds are not associated with any radiation and can be performed easily in a matter of minutes.  Make changes to your daily diet as told by your health care provider. You may be told to:  Limit the amount of salt that you eat.  Eat 5 or more servings of fruits and vegetables each day.  Limit the amount of meat, poultry, fish, and eggs that you eat.  Collect a 24-hour urine sample as told by your health care provider.You may need to collect another urine sample every 6-12  months. SEEK MEDICAL CARE IF:  You experience pain that is progressive and unresponsive to any pain medicine you have been prescribed. SEEK IMMEDIATE MEDICAL CARE IF:   Pain cannot be controlled with the prescribed medicine.  You have a fever or shaking chills.  The severity or intensity of pain increases over  18 hours and is not relieved by pain medicine.  You develop a new onset of abdominal pain.  You feel faint or pass out.  You are unable to urinate.   This information is not intended to replace advice given to you by your health care provider. Make sure you discuss any questions you have with your health care provider.   Document Released: 08/18/2005 Document Revised: 05/09/2015 Document Reviewed: 01/19/2013 Elsevier Interactive Patient Education 2016 [place].  Kidney Stones Kidney stones (urolithiasis) are deposits that form inside your kidneys. The intense pain is caused by the stone moving through the urinary tract. When the stone moves, the ureter goes into spasm around the stone. The stone is usually passed in the urine.  CAUSES   A disorder that makes certain neck glands produce too much parathyroid hormone (primary hyperparathyroidism).  A buildup of uric acid crystals, similar to gout in your joints.  Narrowing (stricture) of the ureter.  A kidney obstruction present at birth (congenital obstruction).  Previous surgery on the kidney or ureters.  Numerous kidney infections. SYMPTOMS   Feeling sick to your stomach (nauseous).  Throwing up (vomiting).  Blood in the urine (hematuria).  Pain that usually spreads (radiates) to the groin.  Frequency or urgency of urination. DIAGNOSIS   Taking a history and physical exam.  Blood or urine tests.  CT scan.  Occasionally, an examination of the inside of the urinary bladder (cystoscopy) is performed. TREATMENT   Observation.  Increasing your fluid intake.  Extracorporeal shock wave lithotripsy--This is a noninvasive procedure that uses shock waves to break up kidney stones.  Surgery may be needed if you have severe pain or persistent obstruction. There are various surgical procedures. Most of the procedures are performed with the use of small instruments. Only small incisions are needed to accommodate these  instruments, so recovery time is minimized. The size, location, and chemical composition are all important variables that will determine the proper choice of action for you. Talk to your health care provider to better understand your situation so that you will minimize the risk of injury to yourself and your kidney.  HOME CARE INSTRUCTIONS   Drink enough water and fluids to keep your urine clear or pale yellow. This will help you to pass the stone or stone fragments.  Strain all urine through the provided strainer. Keep all particulate matter and stones for your health care provider to see. The stone causing the pain may be as small as a grain of salt. It is very important to use the strainer each and every time you pass your urine. The collection of your stone will allow your health care provider to analyze it and verify that a stone has actually passed. The stone analysis will often identify what you can do to reduce the incidence of recurrences.  Only take over-the-counter or prescription medicines for pain, discomfort, or fever as directed by your health care provider.  Keep all follow-up visits as told by your health care provider. This is important.  Get follow-up X-rays if required. The absence of pain does not always mean that the stone has passed. It may have only stopped moving. If  the urine remains completely obstructed, it can cause loss of kidney function or even complete destruction of the kidney. It is your responsibility to make sure X-rays and follow-ups are completed. Ultrasounds of the kidney can show blockages and the status of the kidney. Ultrasounds are not associated with any radiation and can be performed easily in a matter of minutes.  Make changes to your daily diet as told by your health care provider. You may be told to:  Limit the amount of salt that you eat.  Eat 5 or more servings of fruits and vegetables each day.  Limit the amount of meat, poultry, fish, and eggs  that you eat.  Collect a 24-hour urine sample as told by your health care provider.You may need to collect another urine sample every 6-12 months. SEEK MEDICAL CARE IF:  You experience pain that is progressive and unresponsive to any pain medicine you have been prescribed. SEEK IMMEDIATE MEDICAL CARE IF:   Pain cannot be controlled with the prescribed medicine.  You have a fever or shaking chills.  The severity or intensity of pain increases over 18 hours and is not relieved by pain medicine.  You develop a new onset of abdominal pain.  You feel faint or pass out.  You are unable to urinate.   This information is not intended to replace advice given to you by your health care provider. Make sure you discuss any questions you have with your health care provider.   Document Released: 08/18/2005 Document Revised: 05/09/2015 Document Reviewed: 01/19/2013 Elsevier Interactive Patient Education 2016 [place] Guidelines to Help Prevent Kidney Stones Your risk of kidney stones can be decreased by adjusting the foods you eat. The most important thing you can do is drink enough fluid. You should drink enough fluid to keep your urine clear or pale yellow. The following guidelines provide specific information for the type of kidney stone you have had. GUIDELINES ACCORDING TO TYPE OF KIDNEY STONE Calcium Oxalate Kidney Stones  Reduce the amount of salt you eat. Foods that have a lot of salt cause your body to release excess calcium into your urine. The excess calcium can combine with a substance called oxalate to form kidney stones.  Reduce the amount of animal protein you eat if the amount you eat is excessive. Animal protein causes your body to release excess calcium into your urine. Ask your dietitian how much protein from animal sources you should be eating.  Avoid foods that are high in oxalates. If you take vitamins, they should have less than 500 mg of vitamin C. Your  body turns vitamin C into oxalates. You do not need to avoid fruits and vegetables high in vitamin C. Calcium Phosphate Kidney Stones  Reduce the amount of salt you eat to help prevent the release of excess calcium into your urine.  Reduce the amount of animal protein you eat if the amount you eat is excessive. Animal protein causes your body to release excess calcium into your urine. Ask your dietitian how much protein from animal sources you should be eating.  Get enough calcium from food or take a calcium supplement (ask your dietitian for recommendations). Food sources of calcium that do not increase your risk of kidney stones include:  Broccoli.  Dairy products, such as cheese and yogurt.  Pudding. Uric Acid Kidney Stones  Do not have more than 6 oz of animal protein per day. FOOD SOURCES Animal Protein Sources  Meat (all types).  Poultry.  Eggs.  Fish, seafood. Foods High in [employer] seasonings.  Soy sauce.  Teriyaki sauce.  Cured and processed meats.  Salted crackers and snack foods.  Fast food.  Canned soups and most canned foods. Foods High in Oxalates  Grains:  Amaranth.  Barley.  Grits.  Wheat germ.  Bran.  Buckwheat flour.  All bran cereals.  Pretzels.  Whole wheat bread.  Vegetables:  Beans (wax).  Beets and beet greens.  Collard greens.  Eggplant.  [place].  Leeks.  Okra.  Parsley.  Rutabagas.  Spinach.  Swiss chard.  Tomato paste.  Fried potatoes.  Sweet potatoes.  Fruits:  Red currants.  Figs.  Kiwi.  Rhubarb.  Meat and Other Protein Sources:  Beans (dried).  Soy burgers and other soybean products.  Miso.  Nuts (peanuts, almonds, pecans, cashews, hazelnuts).  Nut butters.  Sesame seeds and tahini (paste made of sesame seeds).  Poppy seeds.  Beverages:  Chocolate drink mixes.  Soy milk.  Instant iced tea.  Juices made from high-oxalate fruits or  vegetables.  Other:  Carob.  Chocolate.  Fruitcake.  Marmalades.   This information is not intended to replace advice given to you by your health care provider. Make sure you discuss any questions you have with your health care provider.

## 2016-03-22 NOTE — Progress Notes (Signed)
Pt came up from ED, stated he needed to urinate, given urinal. Pt stated "I think I just passed the stone." Urine strained, and stone placed in specimen container and kept in patients bathroom. Pt not complaining of pain currently, will continue to monitor pt.

## 2016-03-22 NOTE — H&P (Signed)
History and Physical  Patient Name: Corey Gregory     [id number]    DOB: [birth year]    DOA: 03/21/2016 PCP: Jani Gravel, MD   Patient coming from: Home  Chief Complaint: Abdominal pain  HPI: Gottlieb Hartman is a 45 y.o. male with a past medical history significant for HTN, pre-diabetes no longer on metformin and renal stones who presents with abdominal pain.  The patient developed a routine nephrolith in May, had lithotripsy without complication, but stones were large enough that notes at the time suggest a staged procedure might be needed.  Then 7 days ago he had a ureteroscopy with lithotripsy and stent placement.  His symptoms improved and he pulled the stent out two days ago having taken prophylactic ciprofloxacin as recommended. However after pulling up a stent he started to have abdominal pain (below the umbilicus, aching, moderate to severe, intermittent), which progressed into chills, sweats, severe abdominal pain, nausea with recurrent vomiting and diarrhea. Today the pain was substantially worse so he came to the ER.  ED course: -Afebrile, heart rate and respirations normal, hypertensive, saturating well on room air -Na 138, K 3.5, Cr 1.4 (baseline 1.1), WBC 16.3 K, Hgb 15 -UA showed full field RBCs, ketones, nitrites -CT of the abdomen and pelvis showed right Hydro nephrosis, 6 mm right UVJ stone, and stranding around ureters, no pancreatitis or other abdominal abnormalities or SBO     ROS: All other systems negative except as just noted or noted in the history of present illness.    Past Medical History  Diagnosis Date  . Chronic kidney disease 2017  . Hypertension 2007  . Anxiety   . Depression   . Headache     Past Surgical History  Procedure Laterality Date  . Wisdom tooth extraction  1998  . Ganglion cyst excision Right 2005  . Lithotripsy    . Cystoscopy with retrograde pyelogram, ureteroscopy and stent placement Right 03/14/2016    Procedure: CYSTOSCOPY WITH  RIGHT RETROGRADE PYELOGRAM, URETEROSCOPY, LASER, BASKET EXTRACTION  AND STENT PLACEMENT;  Surgeon: Ardis Hughs, MD;  Location: [facility];  Service: Urology;  Laterality: Right;  . Holmium laser application Right [id number]    Procedure: HOLMIUM LASER APPLICATION;  Surgeon: Ardis Hughs, MD;  Location: [facility];  Service: Urology;  Laterality: Right;    Social History: Patient lives with his wife and son.  He works in Biomedical scientist and handy work.  He lives in [place].  He does not smoke or use alcohol.  The patient walks unassisted.    Allergies  Allergen Reactions  . Ciprofloxacin Nausea And Vomiting    Family history: family history includes Kidney Stones in his father; Prostate cancer in his father.  Prior to Admission medications   Medication Sig Start Date End Date Taking? Authorizing Provider  ALPRAZolam (XANAX) 0.25 MG tablet Take 0.25 mg by mouth 2 (two) times daily as needed for anxiety. Anxiety 12/26/15  Yes Historical Provider, MD  lisinopril-hydrochlorothiazide (PRINZIDE,ZESTORETIC) 20-12.5 MG tablet Take 1 tablet by mouth every evening.    Yes Historical Provider, MD  PARoxetine (PAXIL) 20 MG tablet Take 20 mg by mouth every evening.  10/08/15  Yes Historical Provider, MD  phenazopyridine (PYRIDIUM) 200 MG tablet Take 1 tablet (200 mg total) by mouth 3 (three) times daily as needed for pain. 03/14/16  Yes Ardis Hughs, MD  tamsulosin (FLOMAX) 0.4 MG CAPS capsule TAKE 1 CAPSULE BY MOUTH ONCE DAILY AT BEDTIME 02/25/16  Yes Historical Provider, MD  traMADol (  ULTRAM) 50 MG tablet Take 1-2 tablets (50-100 mg total) by mouth every 6 (six) hours as needed for moderate pain. 03/14/16  Yes Ardis Hughs, MD  butorphanol (STADOL) 10 MG/ML nasal spray Place 1 spray into the nose 2 (two) times daily as needed for headache.  12/23/15   Historical Provider, MD  ciprofloxacin (CIPRO) 500 MG tablet Take 1 tablet (500 mg total) by mouth once. Patient not taking: Reported on 03/21/2016  03/14/16   Ardis Hughs, MD       Physical Exam: BP 124/76 mmHg  Pulse 70  Temp(Src) 98.8 F (37.1 C) (Oral)  Resp 18  Ht 6\' 2"  (1.88 m)  Wt 108.863 kg (240 lb)  BMI 30.80 kg/m2  SpO2 96% General appearance: Well-developed, male, alert and in no acute distress.   Eyes: Conjunctiva normal, lids and lashes normal.   PERRL.  ENT: No nasal deformity, discharge.  OP moist without lesions.   Lymph: No cervical or supraclavicular lymphadenopathy. Skin: Warm and dry.  No suspicious rashes or lesions. Cardiac: RRR, nl S1-S2, no murmurs appreciated.  Capillary refill is brisk.  JVP normal.  No LE edema.  Radial and DP pulses 2+ and symmetric. Respiratory: Normal respiratory rate and rhythm.  CTAB without rales or wheezes. GI: Abdomen soft without rigidity.  No TTP. No ascites, distension, hepatosplenomegaly.   MSK: No deformities or effusions.  No clubbing/cyanosis. Neuro: Cranial nerves norma. Sensorium intact and responding to questions, attention normal.  Speech is fluent.  Moves all extremities equally and with normal coordination.    Psych: Affect normal.  Judgment and insight appear normal.       Labs on Admission:  I have personally reviewed following labs and imaging studies: CBC:  Recent Labs Lab 03/21/16 1837  WBC 16.3*  HGB 15.0  HCT 42.3  MCV 86.7  PLT 99991111   Basic Metabolic Panel:  Recent Labs Lab 03/21/16 1837  NA 138  K 3.5  CL 100*  CO2 27  GLUCOSE 138*  BUN 15  CREATININE 1.43*  CALCIUM 9.8   GFR: Estimated Creatinine Clearance: 83.9 mL/min (by C-G formula based on Cr of 1.43).  Liver Function Tests:  Recent Labs Lab 03/21/16 1837  AST 21  ALT 14*  ALKPHOS 88  BILITOT 1.2  PROT 7.9  ALBUMIN 5.0    Recent Labs Lab 03/21/16 1837  LIPASE 71*        Radiological Exams on Admission: Personally reviewed: Ct Abdomen Pelvis W Contrast  03/21/2016  CLINICAL DATA:  Acute abdominal pain, nausea and vomiting, history of nephrolithiasis.  EXAM: CT ABDOMEN AND PELVIS WITH CONTRAST TECHNIQUE: Multidetector CT imaging of the abdomen and pelvis was performed using the standard protocol following bolus administration of intravenous contrast. CONTRAST:  115mL ISOVUE-300 IOPAMIDOL (ISOVUE-300) INJECTION 61% COMPARISON:  01/07/2016 FINDINGS: Lower chest:  No acute findings. Hepatobiliary: No masses or other significant abnormality. Pancreas: No mass, inflammatory changes, or other significant abnormality. Spleen: Within normal limits in size. Stable 10 mm hypodensity in the spleen centrally, image 21 suspect small cyst. Accessory splenule noted. Adrenals/Urinary Tract: Normal adrenal glands. Right kidney demonstrates acute moderate hydro nephrosis and associated right hydroureter. This extends into the pelvis. At the right UVJ, there is an obstructing calculus along the bladder wall measuring 6 mm. Ureteral enhancement as well as periureteral stranding edema noted on the right. Additional punctate nonobstructing right lower pole renal calculi. Left kidney demonstrates large central parapelvic cysts as before without hydronephrosis or obstructing ureteral calculus. Left ureter  is decompressed. Tiny punctate nonobstructing left lower pole intrarenal calculus noted. Urinary bladder demonstrates diffuse wall thickening, asymmetric on the right this may related to underdistention. Difficult to exclude cystitis. Stomach/Bowel: Negative for bowel obstruction, significant dilatation, ileus, or free air. Normal appendix demonstrated. No fluid collection or abscess. Vascular/Lymphatic: No adenopathy. Minor left iliac atherosclerosis. Negative for aneurysm. Reproductive: Prominent but symmetric seminal vesicles, nonspecific. Other: No inguinal abnormality or hernia.  Intact abdominal wall. Musculoskeletal: Minor degenerative changes of the lumbar spine, most pronounced at L5-S1 with a posterior calcified disc complex noted, coronal image image 98. IMPRESSION: Acute  moderately obstructing 6 mm right UVJ calculus with right hydroureteronephrosis. Additional incidental punctate nonobstructing lower pole intrarenal calculi bilaterally. Stable large left parapelvic cysts. Electronically Signed   By: Jerilynn Mages.  Shick M.D.   On: 03/21/2016 20:01        Assessment/Plan  1. Kidney stone with hydronephrosis:  Large stone in May, recent ureteral stent, now with retained 6 mm stone. -NPO at 4AM for possible cystoscopy -Continue tamsulosin, strain urine -Urology consulted, appreciate cares -Hydromorphone, oxycodone or acetaminophen for pain PRN -Ondansetron for nausea PRN -Follow urine culture, continue ceftriaxone for now    2. Elevated creatinine:  Likely from dehydration, stone, ACEi -Hold ACEi -Fluids and repeat BMP tomorrow AM  3. HTN:  -Continue HCTZ -Hold lisinopril given creatinine, restart at d/c  4. Mood:  -Hold alprazolam -Continue Paxil      DVT prophylaxis: SCDs  Code Status: FULL  Family Communication: Wife and son at bedside.  Opportunity for questions was given and all questions were answered.  Disposition Plan: Anticipate observation overnight.  Fluids given, trend renal function.  Urology to consult tomorrow, possible OR tomorrow for stone removal.  Follow urine culture and continue antibitoics for now. Consults called: Urology Admission status: OBS, med surg At the point of initial evaluation, it is my clinical opinion that admission for OBSERVATION is reasonable and necessary because the patient's presenting complaints in the context of their chronic conditions represent sufficient risk of deterioration or significant morbidity to constitute reasonable grounds for close observation in the hospital setting, but that the patient may be medically stable for discharge from the hospital within 24 to 48 hours.    Medical decision making: Patient seen at 12:06 AM on 03/22/2016.  The patient was discussed with Domenic Moras, PA-C. What exists  of the patient's chart was reviewed in depth.  Clinical condition: stable.        Edwin Dada [facility] Pager [phone]

## 2016-03-22 NOTE — Progress Notes (Signed)
Discharge instructions reviewed with patient utilizing teach back method no questions at this time. Patient discharged to home. ?

## 2016-03-22 NOTE — Discharge Summary (Signed)
Physician Discharge Summary  Patient ID: Corey Gregory MRN: [id number] DOB/AGE: 1971-06-18 45 y.o.  Admit date: 03/21/2016 Discharge date: 03/22/2016  Admission Diagnoses: right ureteral stone   Discharge Diagnoses:  Principal Problem:   Kidney stone on right side Active Problems:   Elevated serum creatinine   Essential hypertension   Renal stone   Discharged Condition: good  Hospital Course: Pt admitted with elevated WBC count and right distal ureteral stone. He passed the stone shortly after taking tamsulosin and it was caught in the [place]. He's been well overnight - AFVSS. No N/V. Plan d/c home.   Consults: urology  Significant Diagnostic Studies: CT - right distal ureteral stone   Treatments: IV hydration  Discharge Exam: Blood pressure 123/77, pulse 90, temperature 98.3 F (36.8 C), temperature source Oral, resp. rate 20, height 6\' 2"  (1.88 m), weight 104.1 kg (229 lb 8 oz), SpO2 99 %. Sitting in bed Watching TV A&Ox3 NAD Resp - reg effort and rate Ext - no CCE   Disposition: 01-Home or Self Care  Discharge Instructions    Discharge patient    Complete by:  As directed             Medication List    TAKE these medications        ALPRAZolam 0.25 MG tablet  Commonly known as:  XANAX  Take 0.25 mg by mouth 2 (two) times daily as needed for anxiety. Anxiety     butorphanol 10 MG/ML nasal spray  Commonly known as:  STADOL  Place 1 spray into the nose 2 (two) times daily as needed for headache.     cephALEXin 500 MG capsule  Commonly known as:  KEFLEX  Take 1 capsule (500 mg total) by mouth 3 (three) times daily.     lisinopril-hydrochlorothiazide 20-12.5 MG tablet  Commonly known as:  PRINZIDE,ZESTORETIC  Take 1 tablet by mouth every evening.     PARoxetine 20 MG tablet  Commonly known as:  PAXIL  Take 20 mg by mouth every evening.     phenazopyridine 200 MG tablet  Commonly known as:  PYRIDIUM  Take 1 tablet (200 mg total) by mouth 3 (three)  times daily as needed for pain.     tamsulosin 0.4 MG Caps capsule  Commonly known as:  FLOMAX  TAKE 1 CAPSULE BY MOUTH ONCE DAILY AT BEDTIME     traMADol 50 MG tablet  Commonly known as:  ULTRAM  Take 1-2 tablets (50-100 mg total) by mouth every 6 (six) hours as needed for moderate pain.           Follow-up Information    Follow up with Ardis Hughs, MD.   Specialty:  Urology   Why:  as planned   Contact information:   [place] [address] [phone]       Signed: Festus Aloe 03/22/2016, 10:40 AM

## 2016-03-23 LAB — URINE CULTURE: Culture: <10000 — AB

## 2016-05-19 DIAGNOSIS — R739 Hyperglycemia, unspecified: Secondary | ICD-10-CM | POA: Diagnosis not present

## 2016-05-19 DIAGNOSIS — I10 Essential (primary) hypertension: Secondary | ICD-10-CM | POA: Diagnosis not present

## 2016-06-09 DIAGNOSIS — I10 Essential (primary) hypertension: Secondary | ICD-10-CM | POA: Diagnosis not present

## 2016-06-09 DIAGNOSIS — G43909 Migraine, unspecified, not intractable, without status migrainosus: Secondary | ICD-10-CM | POA: Diagnosis not present

## 2016-06-09 DIAGNOSIS — F419 Anxiety disorder, unspecified: Secondary | ICD-10-CM | POA: Diagnosis not present

## 2016-06-09 DIAGNOSIS — R739 Hyperglycemia, unspecified: Secondary | ICD-10-CM | POA: Diagnosis not present

## 2016-06-09 DIAGNOSIS — Z23 Encounter for immunization: Secondary | ICD-10-CM | POA: Diagnosis not present

## 2016-06-26 DIAGNOSIS — M2021 Hallux rigidus, right foot: Secondary | ICD-10-CM | POA: Diagnosis not present

## 2016-08-11 DIAGNOSIS — M2021 Hallux rigidus, right foot: Secondary | ICD-10-CM | POA: Diagnosis not present

## 2016-08-18 DIAGNOSIS — Z01818 Encounter for other preprocedural examination: Secondary | ICD-10-CM | POA: Diagnosis not present

## 2016-08-26 DIAGNOSIS — M2021 Hallux rigidus, right foot: Secondary | ICD-10-CM | POA: Diagnosis not present

## 2016-08-29 DIAGNOSIS — M2021 Hallux rigidus, right foot: Secondary | ICD-10-CM | POA: Diagnosis not present

## 2016-12-11 DIAGNOSIS — Z125 Encounter for screening for malignant neoplasm of prostate: Secondary | ICD-10-CM | POA: Diagnosis not present

## 2016-12-11 DIAGNOSIS — I10 Essential (primary) hypertension: Secondary | ICD-10-CM | POA: Diagnosis not present

## 2016-12-11 DIAGNOSIS — R739 Hyperglycemia, unspecified: Secondary | ICD-10-CM | POA: Diagnosis not present

## 2016-12-17 DIAGNOSIS — I10 Essential (primary) hypertension: Secondary | ICD-10-CM | POA: Diagnosis not present

## 2016-12-17 DIAGNOSIS — F419 Anxiety disorder, unspecified: Secondary | ICD-10-CM | POA: Diagnosis not present

## 2016-12-17 DIAGNOSIS — Z0000 Encounter for general adult medical examination without abnormal findings: Secondary | ICD-10-CM | POA: Diagnosis not present

## 2016-12-17 DIAGNOSIS — E7800 Pure hypercholesterolemia, unspecified: Secondary | ICD-10-CM | POA: Diagnosis not present

## 2017-03-11 DIAGNOSIS — I10 Essential (primary) hypertension: Secondary | ICD-10-CM | POA: Diagnosis not present

## 2017-03-18 DIAGNOSIS — E7800 Pure hypercholesterolemia, unspecified: Secondary | ICD-10-CM | POA: Diagnosis not present

## 2017-03-18 DIAGNOSIS — F419 Anxiety disorder, unspecified: Secondary | ICD-10-CM | POA: Diagnosis not present

## 2017-03-18 DIAGNOSIS — I10 Essential (primary) hypertension: Secondary | ICD-10-CM | POA: Diagnosis not present

## 2017-05-07 IMAGING — CT CT ABD-PELV W/ CM
2 of 5 series · 15 of 46 positions shown, 17 images · IV contrast (ISOVUE)
Comparison: 01/07/2016

CLINICAL DATA: Acute abdominal pain, nausea and vomiting, history
of nephrolithiasis.

EXAM:
CT ABDOMEN AND PELVIS WITH CONTRAST
TECHNIQUE: Multidetector CT imaging of the abdomen and pelvis was performed
using the standard protocol following bolus administration of
intravenous contrast.
CONTRAST:  100mL PNRJOY-IGG IOPAMIDOL (PNRJOY-IGG) INJECTION 61%

[Series 2: abd/pel with · axial · 0.77mm/px · z∈[-674,-214]mm · 12 of 106 slices shown, 14 images]
[im 7/106  soft-tissue]
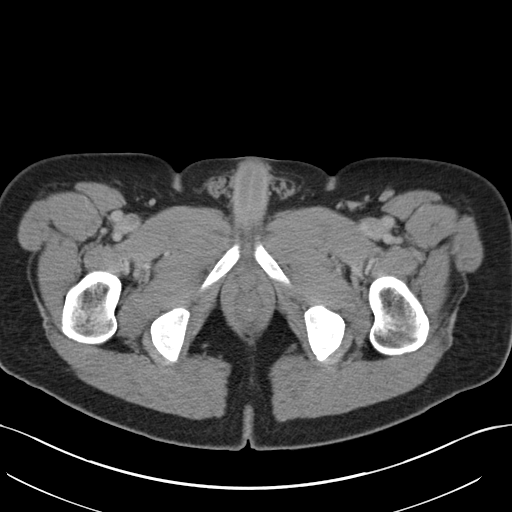
[im 7/106  bone]
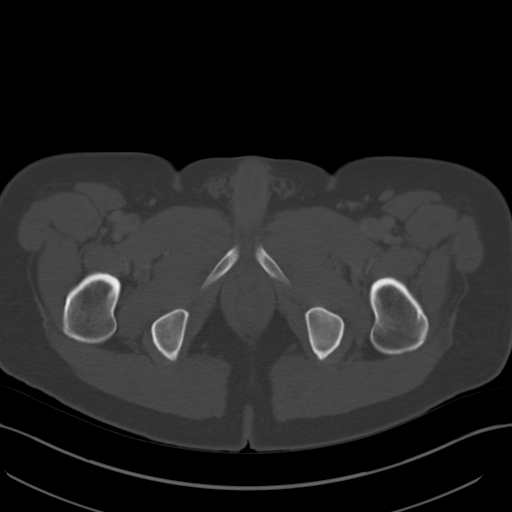
[im 19/106  soft-tissue]
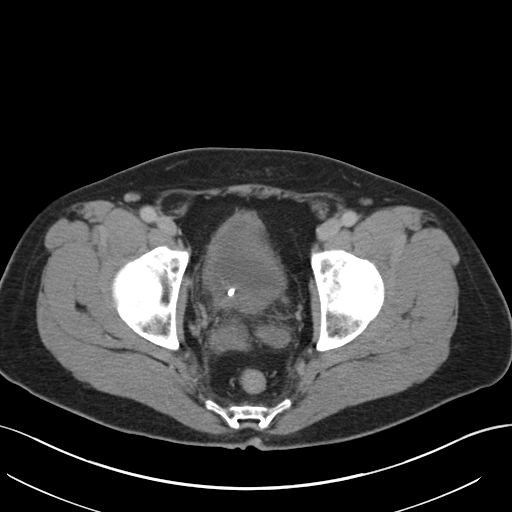
[im 25/106  soft-tissue]
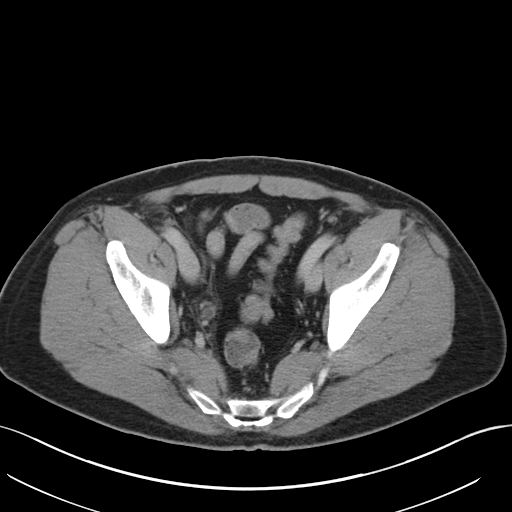
[im 31/106  soft-tissue]
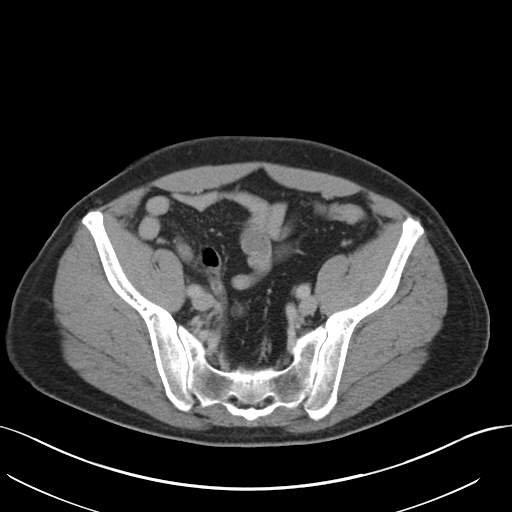
[im 44/106  soft-tissue]
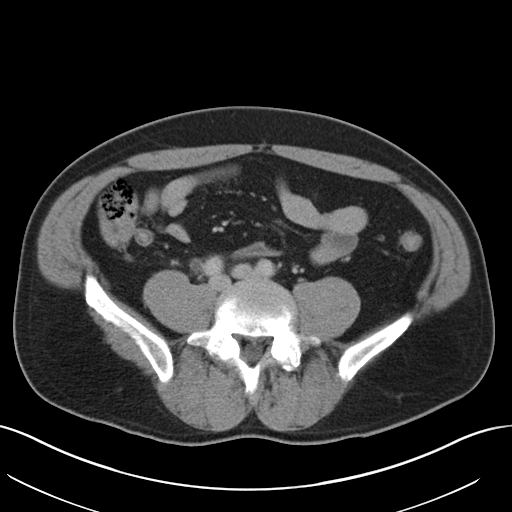
[im 50/106  soft-tissue]
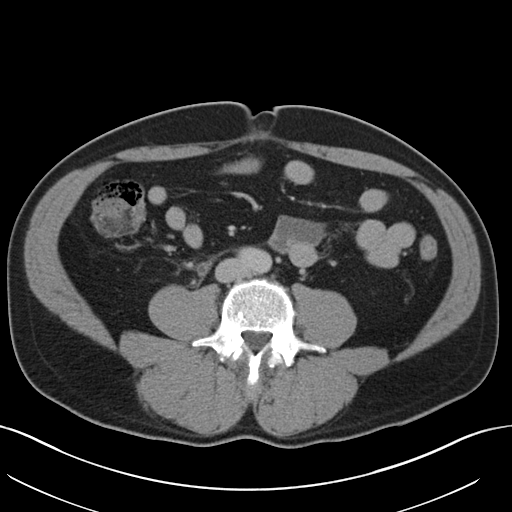
[im 56/106  soft-tissue]
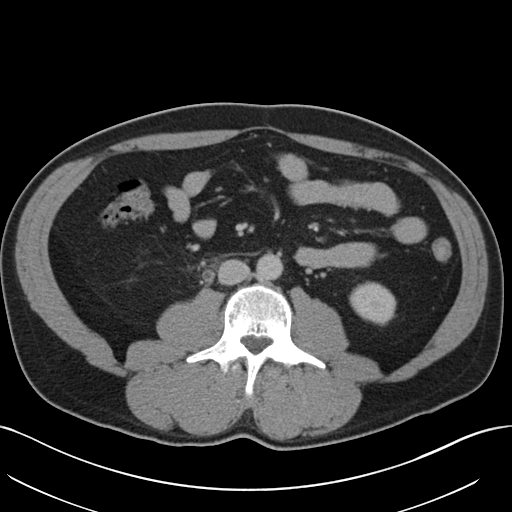
[im 68/106  soft-tissue]
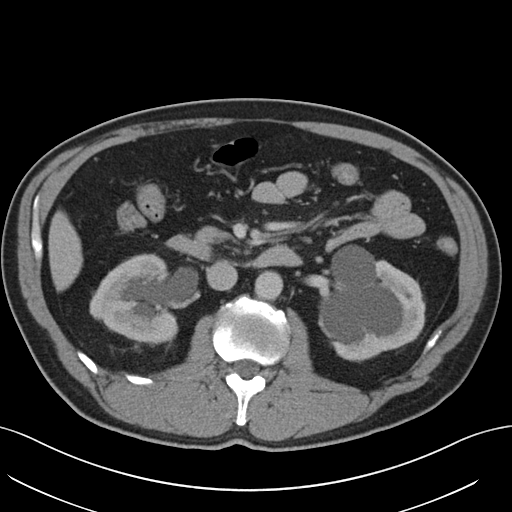
[im 75/106  soft-tissue]
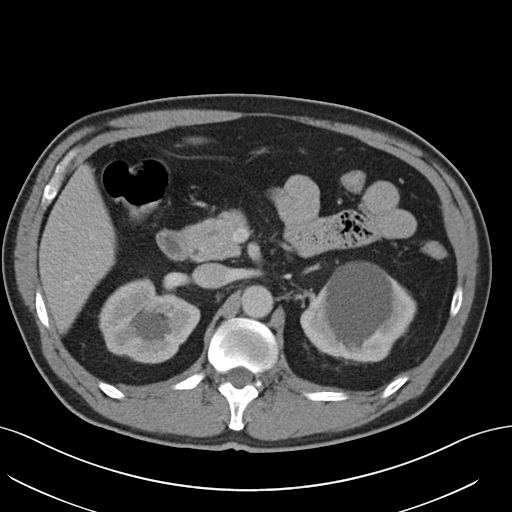
[im 75/106  bone]
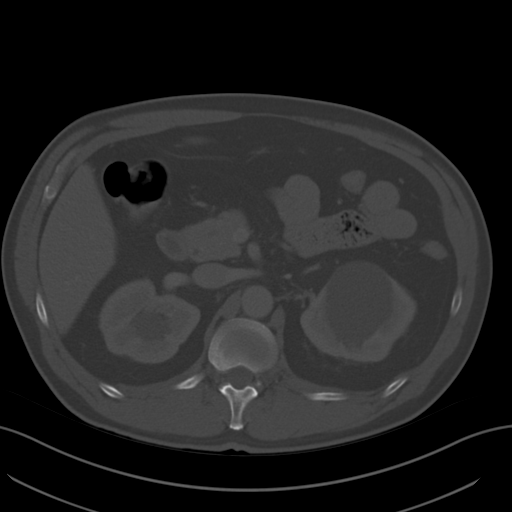
[im 81/106  soft-tissue]
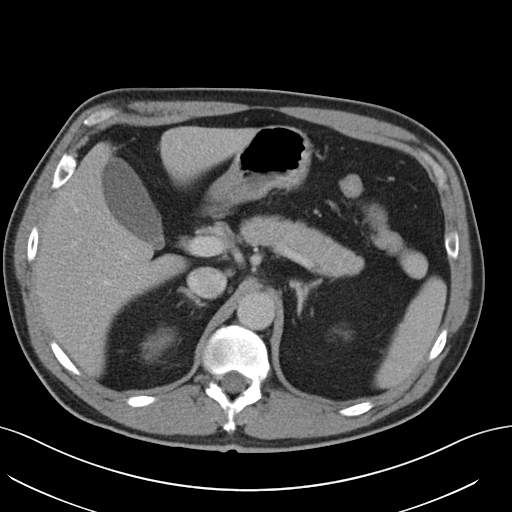
[im 93/106  soft-tissue]
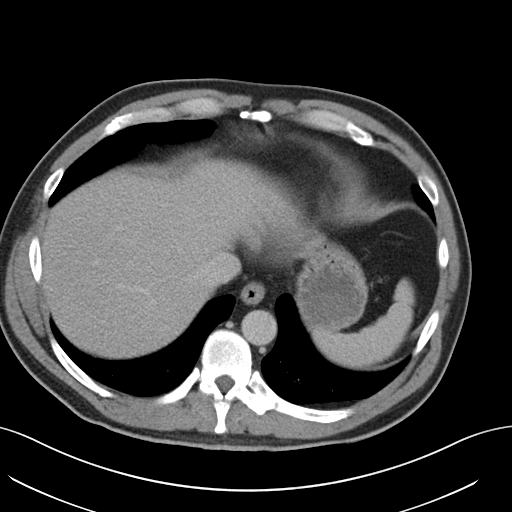
[im 99/106  soft-tissue]
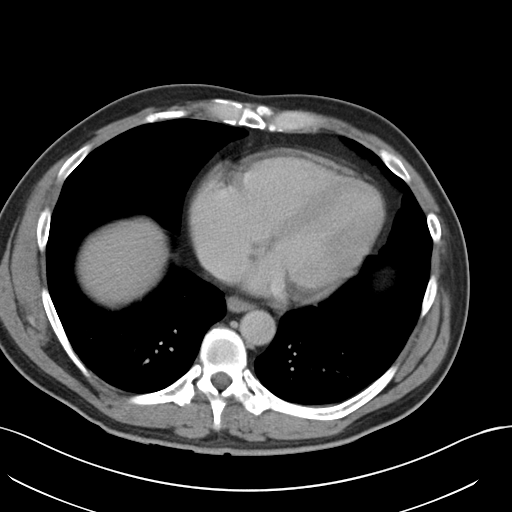

[Series 3: coronal a/|p · coronal · 0.73mm/px · 3 of 137 slices shown]
[im 46/137  soft-tissue]
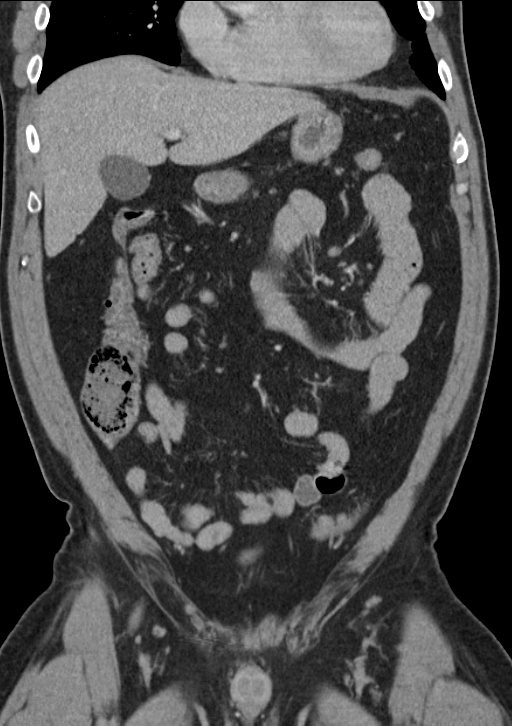
[im 61/137  soft-tissue]
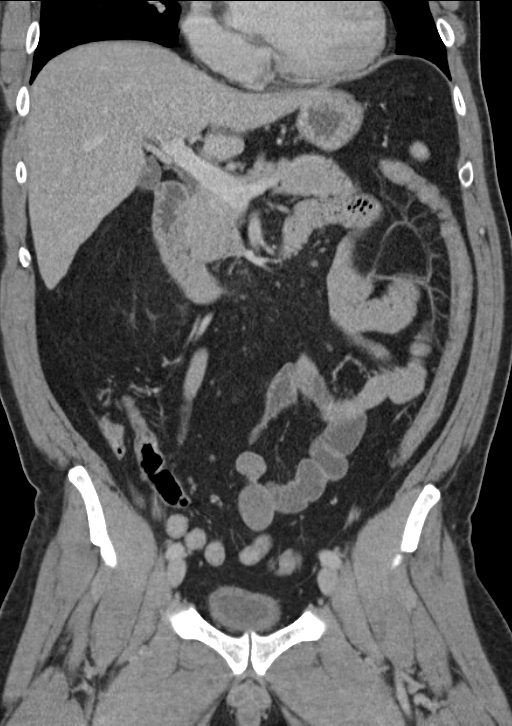
[im 76/137  soft-tissue]
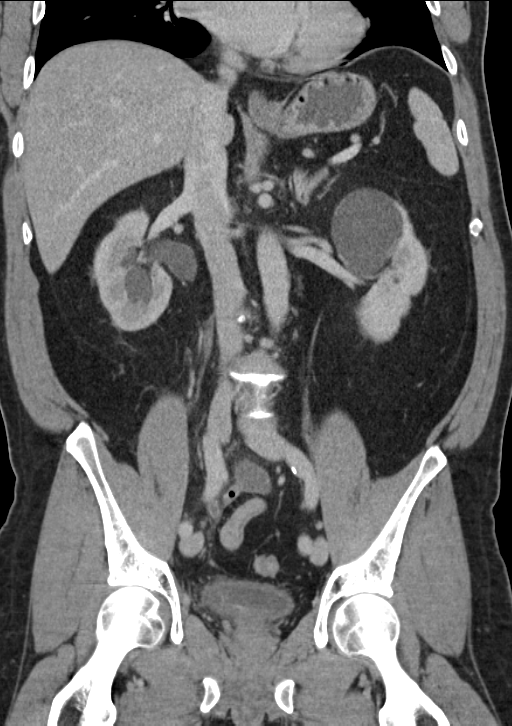

[15 of 46 positions shown; findings below may reference images not displayed]

FINDINGS: Lower chest:  No acute findings.

Hepatobiliary: No masses or other significant abnormality.

Pancreas: No mass, inflammatory changes, or other significant
abnormality.

Spleen: Within normal limits in size. Stable 10 mm hypodensity in
the spleen centrally, image 21 suspect small cyst. Accessory
splenule noted.

Adrenals/Urinary Tract: Normal adrenal glands.

Right kidney demonstrates acute moderate hydro nephrosis and
associated right hydroureter. This extends into the pelvis. At the
right UVJ, there is an obstructing calculus along the bladder wall
measuring 6 mm. Ureteral enhancement as well as periureteral
stranding edema noted on the right. Additional punctate
nonobstructing right lower pole renal calculi.

Left kidney demonstrates large central parapelvic cysts as before
without hydronephrosis or obstructing ureteral calculus. Left ureter
is decompressed. Tiny punctate nonobstructing left lower pole
intrarenal calculus noted.

Urinary bladder demonstrates diffuse wall thickening, asymmetric on
the right this may related to underdistention. Difficult to exclude
cystitis.

Stomach/Bowel: Negative for bowel obstruction, significant
dilatation, ileus, or free air. Normal appendix demonstrated. No
fluid collection or abscess.

Vascular/Lymphatic: No adenopathy. Minor left iliac atherosclerosis.
Negative for aneurysm.

Reproductive: Prominent but symmetric seminal vesicles, nonspecific.

Other: No inguinal abnormality or hernia.  Intact abdominal wall.

Musculoskeletal: Minor degenerative changes of the lumbar spine,
most pronounced at L5-S1 with a posterior calcified disc complex
noted, coronal image image 98.
IMPRESSION: Acute moderately obstructing 6 mm right UVJ calculus with right
hydroureteronephrosis.

Additional incidental punctate nonobstructing lower pole intrarenal
calculi bilaterally.

Stable large left parapelvic cysts.

## 2017-09-14 DIAGNOSIS — I10 Essential (primary) hypertension: Secondary | ICD-10-CM | POA: Diagnosis not present

## 2017-09-17 DIAGNOSIS — E7800 Pure hypercholesterolemia, unspecified: Secondary | ICD-10-CM | POA: Diagnosis not present

## 2017-09-17 DIAGNOSIS — I10 Essential (primary) hypertension: Secondary | ICD-10-CM | POA: Diagnosis not present

## 2017-09-17 DIAGNOSIS — R739 Hyperglycemia, unspecified: Secondary | ICD-10-CM | POA: Diagnosis not present

## 2017-09-17 DIAGNOSIS — F419 Anxiety disorder, unspecified: Secondary | ICD-10-CM | POA: Diagnosis not present

## 2017-10-29 DIAGNOSIS — I10 Essential (primary) hypertension: Secondary | ICD-10-CM | POA: Diagnosis not present

## 2017-10-29 DIAGNOSIS — E7800 Pure hypercholesterolemia, unspecified: Secondary | ICD-10-CM | POA: Diagnosis not present

## 2017-10-29 DIAGNOSIS — F419 Anxiety disorder, unspecified: Secondary | ICD-10-CM | POA: Diagnosis not present

## 2017-10-29 DIAGNOSIS — F321 Major depressive disorder, single episode, moderate: Secondary | ICD-10-CM | POA: Diagnosis not present

## 2018-04-15 DIAGNOSIS — I10 Essential (primary) hypertension: Secondary | ICD-10-CM | POA: Diagnosis not present

## 2018-04-15 DIAGNOSIS — Z0000 Encounter for general adult medical examination without abnormal findings: Secondary | ICD-10-CM | POA: Diagnosis not present

## 2018-04-15 DIAGNOSIS — Z125 Encounter for screening for malignant neoplasm of prostate: Secondary | ICD-10-CM | POA: Diagnosis not present

## 2018-04-22 DIAGNOSIS — I10 Essential (primary) hypertension: Secondary | ICD-10-CM | POA: Diagnosis not present

## 2018-04-22 DIAGNOSIS — E7800 Pure hypercholesterolemia, unspecified: Secondary | ICD-10-CM | POA: Diagnosis not present

## 2018-04-22 DIAGNOSIS — Z0000 Encounter for general adult medical examination without abnormal findings: Secondary | ICD-10-CM | POA: Diagnosis not present

## 2018-10-18 DIAGNOSIS — R739 Hyperglycemia, unspecified: Secondary | ICD-10-CM | POA: Diagnosis not present

## 2018-10-18 DIAGNOSIS — E7800 Pure hypercholesterolemia, unspecified: Secondary | ICD-10-CM | POA: Diagnosis not present

## 2018-10-18 DIAGNOSIS — Z0000 Encounter for general adult medical examination without abnormal findings: Secondary | ICD-10-CM | POA: Diagnosis not present

## 2018-10-18 DIAGNOSIS — I10 Essential (primary) hypertension: Secondary | ICD-10-CM | POA: Diagnosis not present

## 2018-10-25 DIAGNOSIS — Z0000 Encounter for general adult medical examination without abnormal findings: Secondary | ICD-10-CM | POA: Diagnosis not present

## 2018-10-25 DIAGNOSIS — F321 Major depressive disorder, single episode, moderate: Secondary | ICD-10-CM | POA: Diagnosis not present

## 2018-10-25 DIAGNOSIS — E781 Pure hyperglyceridemia: Secondary | ICD-10-CM | POA: Diagnosis not present

## 2018-10-25 DIAGNOSIS — I10 Essential (primary) hypertension: Secondary | ICD-10-CM | POA: Diagnosis not present

## 2019-01-20 DIAGNOSIS — E7800 Pure hypercholesterolemia, unspecified: Secondary | ICD-10-CM | POA: Diagnosis not present

## 2019-01-20 DIAGNOSIS — Z79899 Other long term (current) drug therapy: Secondary | ICD-10-CM | POA: Diagnosis not present

## 2019-01-20 DIAGNOSIS — E781 Pure hyperglyceridemia: Secondary | ICD-10-CM | POA: Diagnosis not present

## 2019-01-20 DIAGNOSIS — I10 Essential (primary) hypertension: Secondary | ICD-10-CM | POA: Diagnosis not present

## 2019-01-25 DIAGNOSIS — E7800 Pure hypercholesterolemia, unspecified: Secondary | ICD-10-CM | POA: Diagnosis not present

## 2019-01-25 DIAGNOSIS — I10 Essential (primary) hypertension: Secondary | ICD-10-CM | POA: Diagnosis not present

## 2019-01-25 DIAGNOSIS — E781 Pure hyperglyceridemia: Secondary | ICD-10-CM | POA: Diagnosis not present

## 2019-04-19 DIAGNOSIS — Z79899 Other long term (current) drug therapy: Secondary | ICD-10-CM | POA: Diagnosis not present

## 2019-04-19 DIAGNOSIS — I10 Essential (primary) hypertension: Secondary | ICD-10-CM | POA: Diagnosis not present

## 2019-04-19 DIAGNOSIS — E781 Pure hyperglyceridemia: Secondary | ICD-10-CM | POA: Diagnosis not present

## 2019-04-19 DIAGNOSIS — R739 Hyperglycemia, unspecified: Secondary | ICD-10-CM | POA: Diagnosis not present

## 2019-04-26 DIAGNOSIS — R739 Hyperglycemia, unspecified: Secondary | ICD-10-CM | POA: Diagnosis not present

## 2019-04-26 DIAGNOSIS — I10 Essential (primary) hypertension: Secondary | ICD-10-CM | POA: Diagnosis not present

## 2019-04-26 DIAGNOSIS — Z0000 Encounter for general adult medical examination without abnormal findings: Secondary | ICD-10-CM | POA: Diagnosis not present

## 2019-04-26 DIAGNOSIS — E7800 Pure hypercholesterolemia, unspecified: Secondary | ICD-10-CM | POA: Diagnosis not present

## 2019-07-19 DIAGNOSIS — Z79899 Other long term (current) drug therapy: Secondary | ICD-10-CM | POA: Diagnosis not present

## 2019-07-19 DIAGNOSIS — I10 Essential (primary) hypertension: Secondary | ICD-10-CM | POA: Diagnosis not present

## 2019-07-19 DIAGNOSIS — R739 Hyperglycemia, unspecified: Secondary | ICD-10-CM | POA: Diagnosis not present

## 2019-07-26 DIAGNOSIS — I10 Essential (primary) hypertension: Secondary | ICD-10-CM | POA: Diagnosis not present

## 2019-07-26 DIAGNOSIS — E7800 Pure hypercholesterolemia, unspecified: Secondary | ICD-10-CM | POA: Diagnosis not present

## 2019-07-26 DIAGNOSIS — F419 Anxiety disorder, unspecified: Secondary | ICD-10-CM | POA: Diagnosis not present

## 2020-08-07 ENCOUNTER — Ambulatory Visit: Payer: Self-pay | Admitting: General Surgery

## 2020-08-07 NOTE — H&P (Signed)
The patient is a 49 year old male who presents with a colonic polyp. 49 year old male who presents to the office for evaluation of an endoscopically unresectable polyp of ascending colon noted to be near the cecum. This was seen on colonoscopy, where several other polyps were removed. 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 Patient has no past surgical history within his abdomen. This was his first colonoscopy. He denies any irregular bowel habits, or chronic diarrhea. He has been having some rectal bleeding recently.   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  Diagnostic Studies History Leighton Ruff, MD; 24/0/9735 10:45 AM) Colonoscopy within last year  Allergies Mammie Lorenzo, LPN; 32/05/9241 68:34 AM) Statins  Medication History Leighton Ruff, MD; 19/01/2228 10:45 AM) Lisinopril-hydroCHLOROthiazide (20-12.5MG  Tablet, Oral) Active. Mirtazapine (15MG  Tablet, Oral) Active. PARoxetine HCl (20MG  Tablet, Oral) Active. Pravastatin Sodium (10MG  Tablet, Oral) Active. Fish Oil (1000MG  Capsule, Oral) Active.   Social History Leighton Ruff, MD; 79/04/9210 10:45 AM) Alcohol use Remotely quit alcohol use. Caffeine use Carbonated beverages. Illicit drug use Uses daily. Tobacco use Former smoker.  Family History Leighton Ruff, MD; 94/09/7406 10:45 AM) Alcohol Abuse Brother. Arthritis Mother. Colon Polyps Father. Depression Mother. Hypertension Mother. Prostate Cancer Father. Respiratory Condition Father.  Other Problems Leighton Ruff, MD; 14/12/8183 10:45 AM) Anxiety Disorder Back Pain Depression High blood pressure Hypercholesterolemia Kidney Stone     Review of Systems Leighton Ruff MD; 63/09/4968 10:45 AM) General Not Present-  Appetite Loss, Chills, Fatigue, Fever, Night Sweats, Weight Gain and Weight Loss. Note: All other systems negative (unless as noted in HPI & included Review of Systems) Skin Not Present- Change in Wart/Mole, Dryness, Hives, Jaundice, New Lesions, Non-Healing Wounds, Rash and Ulcer. HEENT Not Present- Earache, Hearing Loss, Hoarseness, Nose Bleed, Oral Ulcers, Ringing in the Ears, Seasonal Allergies, Sinus Pain, Sore Throat, Visual Disturbances, Wears glasses/contact lenses and Yellow Eyes. Respiratory Not Present- Bloody sputum, Chronic Cough, Difficulty Breathing, Snoring and Wheezing. Breast Not Present- Breast Mass, Breast Pain, Nipple Discharge and Skin Changes. Cardiovascular Not Present- Chest Pain, Difficulty Breathing Lying Down, Leg Cramps, Palpitations, Rapid Heart Rate, Shortness of Breath and Swelling of Extremities. Gastrointestinal Present- Bloody Stool. Not Present- Abdominal Pain, Bloating, Change in Bowel Habits, Chronic diarrhea, Constipation, Difficulty Swallowing, Excessive gas, Gets full quickly at meals, Hemorrhoids, Indigestion, Nausea, Rectal Pain and Vomiting. Male Genitourinary Not Present- Blood in Urine, Change in Urinary Stream, Frequency, Impotence, Nocturia, Painful Urination, Urgency and Urine Leakage. Musculoskeletal Not Present- Back Pain, Joint Pain, Joint Stiffness, Muscle Pain, Muscle Weakness and Swelling of Extremities. Neurological Not Present- Decreased Memory, Fainting, Headaches, Numbness, Seizures, Tingling, Tremor, Trouble walking and Weakness. Psychiatric Not Present- Anxiety, Bipolar, Change in Sleep Pattern, Depression, Fearful and Frequent crying. Endocrine Not Present- Cold Intolerance, Excessive Hunger, Hair Changes, Heat Intolerance and New Diabetes. Hematology Not Present- Easy Bruising, Excessive bleeding, Gland problems, HIV and Persistent Infections.  Vitals Claiborne Billings Dockery LPN; 26/10/7856 85:02 AM) 08/07/2020 10:25 AM Weight: 287.6 lb Height:  75in Body Surface Area: 2.56 m Body Mass Index: 35.95 kg/m  Temp.: 98.98F(Infrared)  Pulse: 98 (Regular)  BP: 146/88(Sitting, Left Arm, Standard)        Physical Exam Leighton Ruff MD; 77/11/1285 10:46 AM)  General Mental Status-Alert. General Appearance-Cooperative.  Abdomen Palpation/Percussion Palpation and Percussion of the abdomen reveal - Soft and Non Tender.    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 This described as carpeting and covering approximately 50% of the surface. Patient has no past surgical history. I have recommended a robotic-assisted right colectomy. We have discussed this in detail including the risk of the polyp harboring a malignancy. All questions were answered. The surgery and anatomy were described to the patient as well as the risks of surgery and the possible complications. These include: Bleeding, deep abdominal infections and possible wound complications such as hernia and infection, damage to adjacent structures, leak of surgical connections, which can lead to other surgeries and possibly an ostomy, possible need for other procedures, such as abscess drains in radiology, possible prolonged hospital stay, possible diarrhea from removal of part of the colon, possible constipation from narcotics, possible bowel, bladder or sexual dysfunction if having rectal surgery, prolonged fatigue/weakness or appetite loss, possible early recurrence of of disease, possible complications of their medical problems such as heart disease or arrhythmias or lung problems, death (less than 1%). I believe the patient understands and wishes to proceed with the surgery.

## 2020-09-10 NOTE — Patient Instructions (Addendum)
DUE TO COVID-19 ONLY ONE VISITOR IS ALLOWED TO COME WITH YOU AND STAY IN THE WAITING ROOM ONLY DURING PRE OP AND PROCEDURE DAY OF SURGERY. THE 1 VISITOR  MAY VISIT WITH YOU AFTER SURGERY IN YOUR PRIVATE ROOM DURING VISITING HOURS ONLY!  YOU NEED TO HAVE A COVID 19 TEST ON: 09/15/20 @ 11:00 AM, THIS TEST MUST BE DONE BEFORE SURGERY,  COVID TESTING SITE [address] [place] [id number], IT IS ON THE RIGHT GOING OUT [address] APPROXIMATELY  2 MINUTES PAST [employer] ON THE RIGHT. ONCE YOUR COVID TEST IS COMPLETED,  PLEASE BEGIN THE QUARANTINE INSTRUCTIONS AS OUTLINED IN YOUR HANDOUT.                Corey Gregory    Your procedure is scheduled on: 09/19/20   Report to [facility] Main  Entrance   Report to admitting at: 6:30 AM     Call this number if you have problems the morning of surgery [phone]    Remember:   DRINK 2 PRESURGERY ENSURE DRINKS THE NIGHT BEFORE SURGERY AT  1000 PM AND 1 PRESURGERY DRINK THE DAY OF THE PROCEDURE 3 HOURS PRIOR TO SCHEDULED SURGERY. NO SOLIDS AFTER MIDNIGHT THE DAY PRIOR TO THE SURGERY. NOTHING BY MOUTH EXCEPT CLEAR LIQUIDS UNTIL THREE HOURS PRIOR TO SCHEDULED SURGERY. PLEASE FINISH PRESURGERY ENSURE DRINK PER SURGEON ORDER 3 HOURS PRIOR TO SCHEDULED SURGERY TIME WHICH NEEDS TO BE COMPLETED AT: 5:30 AM.  CLEAR LIQUID DIET   Foods Allowed                                                                     Foods Excluded  Coffee and tea, regular and decaf                             liquids that you cannot  Plain Jell-O any favor except red or purple                                           see through such as: Fruit ices (not with fruit pulp)                                     milk, soups, orange juice  Iced Popsicles                                    All solid food Carbonated beverages, regular and diet                                    Cranberry, grape and apple juices Sports drinks like Gatorade Lightly seasoned  clear broth or consume(fat free) Sugar, honey syrup  Sample Menu Breakfast  Lunch                                     Supper Cranberry juice                    Beef broth                            Chicken broth Jell-O                                     Grape juice                           Apple juice Coffee or tea                        Jell-O                                      Popsicle                                                Coffee or tea                        Coffee or tea  _____________________________________________________________________  BRUSH YOUR TEETH MORNING OF SURGERY AND RINSE YOUR MOUTH OUT, NO CHEWING GUM CANDY OR MINTS.                               You may not have any metal on your body including hair pins and              piercings  Do not wear jewelry, make-up, lotions, powders or perfumes, deodorant             Men may shave face and neck.   Do not bring valuables to the hospital. [facility] IS NOT             RESPONSIBLE   FOR VALUABLES.  Contacts, dentures or bridgework may not be worn into surgery.  Leave suitcase in the car. After surgery it may be brought to your room.     Patients discharged the day of surgery will not be allowed to drive home. IF YOU ARE HAVING SURGERY AND GOING HOME THE SAME DAY, YOU MUST HAVE AN ADULT TO DRIVE YOU HOME AND BE WITH YOU FOR 24 HOURS. YOU MAY GO HOME BY TAXI OR UBER OR ORTHERWISE, BUT AN ADULT MUST ACCOMPANY YOU HOME AND STAY WITH YOU FOR 24 HOURS.  Name and phone number of your driver:  Special Instructions: N/A              Please read over the following fact sheets you were given: _____________________________________________________________________        [facility] - Preparing for Surgery Before surgery, you can play an important role.  Because skin is not sterile, your skin needs to be as free of germs  as possible.  You can reduce the number of germs on your skin by washing  with CHG (chlorahexidine gluconate) soap before surgery.  CHG is an antiseptic cleaner which kills germs and bonds with the skin to continue killing germs even after washing. Please DO NOT use if you have an allergy to CHG or antibacterial soaps.  If your skin becomes reddened/irritated stop using the CHG and inform your nurse when you arrive at Short Stay. Do not shave (including legs and underarms) for at least 48 hours prior to the first CHG shower.  You may shave your face/neck. Please follow these instructions carefully:  1.  Shower with CHG Soap the night before surgery and the  morning of Surgery.  2.  If you choose to wash your hair, wash your hair first as usual with your  normal  shampoo.  3.  After you shampoo, rinse your hair and body thoroughly to remove the  shampoo.                           4.  Use CHG as you would any other liquid soap.  You can apply chg directly  to the skin and wash                       Gently with a scrungie or clean washcloth.  5.  Apply the CHG Soap to your body ONLY FROM THE NECK DOWN.   Do not use on face/ open                           Wound or open sores. Avoid contact with eyes, ears mouth and genitals (private parts).                       Wash face,  Genitals (private parts) with your normal soap.             6.  Wash thoroughly, paying special attention to the area where your surgery  will be performed.  7.  Thoroughly rinse your body with warm water from the neck down.  8.  DO NOT shower/wash with your normal soap after using and rinsing off  the CHG Soap.                9.  Pat yourself dry with a clean towel.            10.  Wear clean pajamas.            11.  Place clean sheets on your bed the night of your first shower and do not  sleep with pets. Day of Surgery : Do not apply any lotions/deodorants the morning of surgery.  Please wear clean clothes to the hospital/surgery center.  FAILURE TO FOLLOW THESE INSTRUCTIONS MAY RESULT IN THE  CANCELLATION OF YOUR SURGERY PATIENT SIGNATURE_________________________________  NURSE SIGNATURE__________________________________  ________________________________________________________________________   Rogelia Mire  An incentive spirometer is a tool that can help keep your lungs clear and active. This tool measures how well you are filling your lungs with each breath. Taking long deep breaths may help reverse or decrease the chance of developing breathing (pulmonary) problems (especially infection) following:  A long period of time when you are unable to move or be active. BEFORE THE PROCEDURE   If the spirometer includes an indicator to show your best effort, your nurse or  respiratory therapist will set it to a desired goal.  If possible, sit up straight or lean slightly forward. Try not to slouch.  Hold the incentive spirometer in an upright position. INSTRUCTIONS FOR USE  1. Sit on the edge of your bed if possible, or sit up as far as you can in bed or on a chair. 2. Hold the incentive spirometer in an upright position. 3. Breathe out normally. 4. Place the mouthpiece in your mouth and seal your lips tightly around it. 5. Breathe in slowly and as deeply as possible, raising the piston or the ball toward the top of the column. 6. Hold your breath for 3-5 seconds or for as long as possible. Allow the piston or ball to fall to the bottom of the column. 7. Remove the mouthpiece from your mouth and breathe out normally. 8. Rest for a few seconds and repeat Steps 1 through 7 at least 10 times every 1-2 hours when you are awake. Take your time and take a few normal breaths between deep breaths. 9. The spirometer may include an indicator to show your best effort. Use the indicator as a goal to work toward during each repetition. 10. After each set of 10 deep breaths, practice coughing to be sure your lungs are clear. If you have an incision (the cut made at the time of surgery),  support your incision when coughing by placing a pillow or rolled up towels firmly against it. Once you are able to get out of bed, walk around indoors and cough well. You may stop using the incentive spirometer when instructed by your caregiver.  RISKS AND COMPLICATIONS  Take your time so you do not get dizzy or light-headed.  If you are in pain, you may need to take or ask for pain medication before doing incentive spirometry. It is harder to take a deep breath if you are having pain. AFTER USE  Rest and breathe slowly and easily.  It can be helpful to keep track of a log of your progress. Your caregiver can provide you with a simple table to help with this. If you are using the spirometer at home, follow these instructions: SEEK MEDICAL CARE IF:   You are having difficultly using the spirometer.  You have trouble using the spirometer as often as instructed.  Your pain medication is not giving enough relief while using the spirometer.  You develop fever of 100.5 F (38.1 C) or higher. SEEK IMMEDIATE MEDICAL CARE IF:   You cough up bloody sputum that had not been present before.  You develop fever of 102 F (38.9 C) or greater.  You develop worsening pain at or near the incision site. MAKE SURE YOU:   Understand these instructions.  Will watch your condition.  Will get help right away if you are not doing well or get worse. Document Released: 12/29/2006 Document Revised: 11/10/2011 Document Reviewed: 03/01/2007 [facility] Patient Information 2014 [place], [place].   ________________________________________________________________________

## 2020-09-11 ENCOUNTER — Other Ambulatory Visit: Payer: Self-pay

## 2020-09-11 ENCOUNTER — Encounter (HOSPITAL_COMMUNITY)
Admission: RE | Admit: 2020-09-11 | Discharge: 2020-09-11 | Disposition: A | Discharge status: Home / Self Care | Payer: 59 | Source: Ambulatory Visit | Attending: General Surgery | Admitting: General Surgery

## 2020-09-11 ENCOUNTER — Encounter (HOSPITAL_COMMUNITY): Payer: Self-pay

## 2020-09-11 DIAGNOSIS — Z01818 Encounter for other preprocedural examination: Secondary | ICD-10-CM | POA: Insufficient documentation

## 2020-09-11 HISTORY — DX: Personal history of urinary calculi: Z87.442

## 2020-09-11 LAB — BASIC METABOLIC PANEL
Anion gap: 6 (ref 5–15)
BUN: 12 mg/dL (ref 6–20)
CO2: 32 mmol/L (ref 22–32)
Calcium: 9.2 mg/dL (ref 8.9–10.3)
Chloride: 103 mmol/L (ref 98–111)
Creatinine, Ser: 1.13 mg/dL (ref 0.61–1.24)
GFR, Estimated: >60 mL/min (ref >60)
Glucose, Bld: 116 mg/dL — ABNORMAL HIGH (ref 70–99)
Potassium: 4.4 mmol/L (ref 3.5–5.1)
Sodium: 141 mmol/L (ref 135–145)

## 2020-09-11 LAB — CBC
HCT: 43.3 % (ref 39.0–52.0)
Hemoglobin: 15 g/dL (ref 13.0–17.0)
MCH: 31.1 pg (ref 26.0–34.0)
MCHC: 34.6 g/dL (ref 30.0–36.0)
MCV: 89.6 fL (ref 80.0–100.0)
Platelets: 230 10*3/uL (ref 150–400)
RBC: 4.83 MIL/uL (ref 4.22–5.81)
RDW: 12 % (ref 11.5–15.5)
WBC: 8 10*3/uL (ref 4.0–10.5)
nRBC: 0 % (ref 0.0–0.2)

## 2020-09-11 NOTE — Progress Notes (Addendum)
COVID Vaccine Completed: NO Date COVID Vaccine completed: COVID vaccine manufacturer: [place] [facility]   PCP - Dr. Jani Gravel Cardiologist -   Chest x-ray -  EKG -  Stress Test -  ECHO -  Cardiac Cath -  Pacemaker/ICD device last checked:  Sleep Study -  CPAP -   Fasting Blood Sugar -  Checks Blood Sugar _____ times a day  Blood Thinner Instructions: Aspirin Instructions: Last Dose:  Anesthesia review: Hx: HTN  Patient denies shortness of breath, fever, cough and chest pain at PAT appointment   Patient verbalized understanding of instructions that were given to them at the PAT appointment. Patient was also instructed that they will need to review over the PAT instructions again at home before surgery.

## 2020-09-15 ENCOUNTER — Other Ambulatory Visit (HOSPITAL_COMMUNITY): Payer: BLUE CROSS/BLUE SHIELD

## 2020-09-24 NOTE — Patient Instructions (Addendum)
DUE TO COVID-19 ONLY ONE VISITOR IS ALLOWED TO COME WITH YOU AND STAY IN THE WAITING ROOM ONLY DURING PRE OP AND PROCEDURE.   IF YOU WILL BE ADMITTED INTO THE HOSPITAL YOU ARE ALLOWED ONE SUPPORT PERSON DURING VISITATION HOURS ONLY (10AM -8PM)   . The support person may change daily. . The support person must pass our screening, gel in and out, and wear a mask at all times, including in the patient's room. . Patients must also wear a mask when staff or their support person are in the room.   COVID SWAB TESTING MUST BE COMPLETED ON:   09-25-20 @ 2:55 PM   [address] [place], [place] [address]  (Must self quarantine after testing. Follow instructions on handout.)         Your procedure is scheduled on:  Friday, 09-28-20   Report to [facility] Main  Entrance   Report to admitting at 9:30 AM   Call this number if you have problems the morning of surgery [phone]    Follow prep per surgeon's office   Do not eat food :After Midnight.   May have liquids until 8:30 AM day of surgery  CLEAR LIQUID DIET  Foods Allowed                                                                     Foods Excluded  Water, Black Coffee and tea, regular and decaf              liquids that you cannot  Plain Jell-O in any flavor  (No red)                                    see through such as: Fruit ices (not with fruit pulp)                                      milk, soups, orange juice              Iced Popsicles (No red)                                      All solid food                                   Apple juices Sports drinks like Gatorade (No red) Lightly seasoned clear broth or consume(fat free) Sugar, honey syrup  Sample Menu Breakfast                                Lunch                                     Supper Cranberry juice  Beef broth                            Chicken broth Jell-O                                     Grape juice                            Apple juice Coffee or tea                        Jell-O                                      Popsicle                                                  Coffee or tea                        Coffee or tea       Drink 2 Ensure drinks the night before surgery by 10:00 PM.   Complete one Ensure drink the morning of surgery 3 hours prior to scheduled surgery at 8:30 AM.     1. The day of surgery:  ? Drink ONE (1) Pre-Surgery Clear Ensure or G2 by am the morning of surgery. Drink in one sitting. Do not sip.  ? This drink was given to you during your hospital  pre-op appointment visit. ? Nothing else to drink after completing the  Pre-Surgery Clear Ensure or G2.          If you have questions, please contact your surgeon's office.     Oral Hygiene is also important to reduce your risk of infection.                                    Remember - BRUSH YOUR TEETH THE MORNING OF SURGERY WITH YOUR REGULAR TOOTHPASTE   Do NOT smoke after Midnight   Take these medicines the morning of surgery with A SIP OF WATER:   None                               You may not have any metal on your body including jewelry, and body piercings             Do not wear lotions, powders, perfumes/cologne, or deodorant            Men may shave face and neck.   Do not bring valuables to the hospital. [place].   Contacts, dentures or bridgework may not be worn into surgery.   Bring small overnight bag day of surgery.                Please read over the following fact sheets you were given: IF YOU HAVE QUESTIONS ABOUT YOUR PRE OP INSTRUCTIONS PLEASE CALL  [facility] - Preparing for Surgery Before surgery, you can play an important role.  Because skin is not sterile, your skin needs to be as free of germs as possible.  You can reduce the number of germs on your skin by washing with CHG (chlorahexidine gluconate) soap before surgery.  CHG is an antiseptic  cleaner which kills germs and bonds with the skin to continue killing germs even after washing. Please DO NOT use if you have an allergy to CHG or antibacterial soaps.  If your skin becomes reddened/irritated stop using the CHG and inform your nurse when you arrive at Short Stay. Do not shave (including legs and underarms) for at least 48 hours prior to the first CHG shower.  You may shave your face/neck.  Please follow these instructions carefully:  1.  Shower with CHG Soap the night before surgery and the  morning of surgery.  2.  If you choose to wash your hair, wash your hair first as usual with your normal  shampoo.  3.  After you shampoo, rinse your hair and body thoroughly to remove the shampoo.                             4.  Use CHG as you would any other liquid soap.  You can apply chg directly to the skin and wash.  Gently with a scrungie or clean washcloth.  5.  Apply the CHG Soap to your body ONLY FROM THE NECK DOWN.   Do   not use on face/ open                           Wound or open sores. Avoid contact with eyes, ears mouth and   genitals (private parts).                       Wash face,  Genitals (private parts) with your normal soap.             6.  Wash thoroughly, paying special attention to the area where your    surgery  will be performed.  7.  Thoroughly rinse your body with warm water from the neck down.  8.  DO NOT shower/wash with your normal soap after using and rinsing off the CHG Soap.                9.  Pat yourself dry with a clean towel.            10.  Wear clean pajamas.            11.  Place clean sheets on your bed the night of your first shower and do not  sleep with pets. Day of Surgery : Do not apply any lotions/deodorants the morning of surgery.  Please wear clean clothes to the hospital/surgery center.  FAILURE TO FOLLOW THESE INSTRUCTIONS MAY RESULT IN THE CANCELLATION OF YOUR SURGERY  PATIENT SIGNATURE_________________________________  NURSE  SIGNATURE__________________________________  ________________________________________________________________________  WHAT IS A BLOOD TRANSFUSION? Blood Transfusion Information  A transfusion is the replacement of blood or some of its parts. Blood is made up of multiple cells which provide different functions.  Red blood cells carry oxygen and are used for blood loss replacement.  White blood cells fight against infection.  Platelets control bleeding.  Plasma helps clot blood.  Other blood products are available  for specialized needs, such as hemophilia or other clotting disorders. BEFORE THE TRANSFUSION  Who gives blood for transfusions?   Healthy volunteers who are fully evaluated to make sure their blood is safe. This is blood bank blood. Transfusion therapy is the safest it has ever been in the practice of medicine. Before blood is taken from a donor, a complete history is taken to make sure that person has no history of diseases nor engages in risky social behavior (examples are intravenous drug use or sexual activity with multiple partners). The donor's travel history is screened to minimize risk of transmitting infections, such as malaria. The donated blood is tested for signs of infectious diseases, such as HIV and hepatitis. The blood is then tested to be sure it is compatible with you in order to minimize the chance of a transfusion reaction. If you or a relative donates blood, this is often done in anticipation of surgery and is not appropriate for emergency situations. It takes many days to process the donated blood. RISKS AND COMPLICATIONS Although transfusion therapy is very safe and saves many lives, the main dangers of transfusion include:   Getting an infectious disease.  Developing a transfusion reaction. This is an allergic reaction to something in the blood you were given. Every precaution is taken to prevent this. The decision to have a blood transfusion has been  considered carefully by your caregiver before blood is given. Blood is not given unless the benefits outweigh the risks. AFTER THE TRANSFUSION  Right after receiving a blood transfusion, you will usually feel much better and more energetic. This is especially true if your red blood cells have gotten low (anemic). The transfusion raises the level of the red blood cells which carry oxygen, and this usually causes an energy increase.  The nurse administering the transfusion will monitor you carefully for complications. HOME CARE INSTRUCTIONS  No special instructions are needed after a transfusion. You may find your energy is better. Speak with your caregiver about any limitations on activity for underlying diseases you may have. SEEK MEDICAL CARE IF:   Your condition is not improving after your transfusion.  You develop redness or irritation at the intravenous (IV) site. SEEK IMMEDIATE MEDICAL CARE IF:  Any of the following symptoms occur over the next 12 hours:  Shaking chills.  You have a temperature by mouth above 102 F (38.9 C), not controlled by medicine.  Chest, back, or muscle pain.  People around you feel you are not acting correctly or are confused.  Shortness of breath or difficulty breathing.  Dizziness and fainting.  You get a rash or develop hives.  You have a decrease in urine output.  Your urine turns a dark color or changes to pink, red, or brown. Any of the following symptoms occur over the next 10 days:  You have a temperature by mouth above 102 F (38.9 C), not controlled by medicine.  Shortness of breath.  Weakness after normal activity.  The white part of the eye turns yellow (jaundice).  You have a decrease in the amount of urine or are urinating less often.  Your urine turns a dark color or changes to pink, red, or brown. Document Released: 08/15/2000 Document Revised: 11/10/2011 Document Reviewed: 04/03/2008 [facility] Patient Information 2014  [place], [place].  _______________________________________________________________________

## 2020-09-25 ENCOUNTER — Inpatient Hospital Stay (HOSPITAL_COMMUNITY): Admission: RE | Admit: 2020-09-25 | Payer: 59 | Source: Ambulatory Visit

## 2020-09-25 ENCOUNTER — Encounter (HOSPITAL_COMMUNITY)
Admission: RE | Admit: 2020-09-25 | Discharge: 2020-09-25 | Disposition: A | Discharge status: Home / Self Care | Payer: 59 | Source: Ambulatory Visit | Attending: Internal Medicine | Admitting: Internal Medicine

## 2020-09-26 NOTE — Progress Notes (Signed)
Left another voicemail for Corey Gregory to reschedule lab and COVID appointments that he missed on 09/25/2020.

## 2020-09-27 MED ORDER — BUPIVACAINE LIPOSOME 1.3 % IJ SUSP
20.0000 mL | Freq: Once | INTRAMUSCULAR | Status: DC
  Filled 2020-09-27 (×2): qty 20

## 2020-09-27 NOTE — Progress Notes (Signed)
Called patient, Pt.stated " I have Covid, I called the office", I'm not coming", patient instructed to call the office again. Patient verbalized understanding.

## 2020-10-31 NOTE — Patient Instructions (Addendum)
DUE TO COVID-19 ONLY ONE VISITOR IS ALLOWED TO COME WITH YOU AND STAY IN THE WAITING ROOM ONLY DURING PRE OP AND PROCEDURE DAY OF SURGERY. THE 1 VISITOR  MAY VISIT WITH YOU AFTER SURGERY IN YOUR PRIVATE ROOM DURING VISITING HOURS ONLY!  YOU NEED TO HAVE A COVID 19 TEST ON_3/8______ @_2 :00______, THIS TEST MUST BE DONE BEFORE SURGERY,  COVID TESTING SITE [place] [address], IT IS ON THE RIGHT GOING OUT [address] APPROXIMATELY  2 MINUTES PAST [employer] ON THE RIGHT. ONCE YOUR COVID TEST IS COMPLETED,  PLEASE BEGIN THE QUARANTINE INSTRUCTIONS AS OUTLINED IN YOUR HANDOUT.                Corey Gregory    Your procedure is scheduled on: 11/09/20   Report to [facility] Main  Entrance   Report to Short stay at 5:30 AM     Call this number if you have problems the morning of surgery [phone]    . BRUSH YOUR TEETH MORNING OF SURGERY AND RINSE YOUR MOUTH OUT, NO CHEWING GUM CANDY OR MINTS.  Drink plenty of fluids on the day of prep to prevent dehydration.  Follow instructions for bowel prep from Dr. Manon Hilding office    DRINK 2 [place] AT 10:00 PM .  . NO SOLIDS AFTER MIDNIGHT THE DAY PRIOR TO THE SURGERY   Drink the last ensure at 4:00 am the morning of surgery. Nothing more by mouth after 4:30 AM    Take these medicines the morning of surgery with A SIP OF WATER: none                                 You may not have any metal on your body including              piercings  Do not wear jewelry,  lotions, powders or deodorant              Men may shave face and neck.   Do not bring valuables to the hospital. [place].  Contacts, dentures or bridgework may not be worn into surgery.     Special Instructions: N/A              Please read over the following fact sheets you were  given: _____________________________________________________________________             [facility] - Preparing for Surgery Before surgery, you can play an important role.  Because skin is not sterile, your skin needs to be as free of germs as possible.  You can reduce the number of germs on your skin by washing with CHG (chlorahexidine gluconate) soap before surgery.  CHG is an antiseptic cleaner which kills germs and bonds with the skin to continue killing germs even after washing. Please DO NOT use if you have an allergy to CHG or antibacterial soaps.  If your skin becomes reddened/irritated stop using the CHG and inform your nurse when you arrive at Short Stay. .  You may shave your face/neck.  Please follow these instructions carefully:  1.  Shower with CHG Soap the night before surgery and the  morning of Surgery.  2.  If you choose to wash your hair, wash your hair first  as usual with your  normal  shampoo.  3.  After you shampoo, rinse your hair and body thoroughly to remove the  shampoo.                                        4.  Use CHG as you would any other liquid soap.  You can apply chg directly  to the skin and wash                       Gently with a scrungie or clean washcloth.  5.  Apply the CHG Soap to your body ONLY FROM THE NECK DOWN.   Do not use on face/ open                           Wound or open sores. Avoid contact with eyes, ears mouth and genitals (private parts).                       Wash face,  Genitals (private parts) with your normal soap.             6.  Wash thoroughly, paying special attention to the area where your surgery  will be performed.  7.  Thoroughly rinse your body with warm water from the neck down.  8.  DO NOT shower/wash with your normal soap after using and rinsing off  the CHG Soap.             9.  Pat yourself dry with a clean towel.            10.  Wear clean pajamas.            11.  Place clean sheets on your bed the night of your first  shower and do not  sleep with pets. Day of Surgery : Do not apply any lotions/deodorants the morning of surgery.  Please wear clean clothes to the hospital/surgery center.  FAILURE TO FOLLOW THESE INSTRUCTIONS MAY RESULT IN THE CANCELLATION OF YOUR SURGERY PATIENT SIGNATURE_________________________________  NURSE SIGNATURE__________________________________  ________________________________________________________________________

## 2020-11-01 ENCOUNTER — Encounter (HOSPITAL_COMMUNITY): Payer: Self-pay

## 2020-11-01 ENCOUNTER — Encounter (HOSPITAL_COMMUNITY)
Admission: RE | Admit: 2020-11-01 | Discharge: 2020-11-01 | Disposition: A | Discharge status: Home / Self Care | Payer: 59 | Source: Ambulatory Visit | Attending: General Surgery | Admitting: General Surgery

## 2020-11-01 ENCOUNTER — Other Ambulatory Visit: Payer: Self-pay

## 2020-11-01 DIAGNOSIS — Z01812 Encounter for preprocedural laboratory examination: Secondary | ICD-10-CM | POA: Diagnosis present

## 2020-11-01 LAB — BASIC METABOLIC PANEL
Anion gap: 7 (ref 5–15)
BUN: 10 mg/dL (ref 6–20)
CO2: 27 mmol/L (ref 22–32)
Calcium: 8.9 mg/dL (ref 8.9–10.3)
Chloride: 106 mmol/L (ref 98–111)
Creatinine, Ser: 1.05 mg/dL (ref 0.61–1.24)
GFR, Estimated: >60 mL/min (ref >60)
Glucose, Bld: 117 mg/dL — ABNORMAL HIGH (ref 70–99)
Potassium: 4 mmol/L (ref 3.5–5.1)
Sodium: 140 mmol/L (ref 135–145)

## 2020-11-01 LAB — CBC
HCT: 42.1 % (ref 39.0–52.0)
Hemoglobin: 14.3 g/dL (ref 13.0–17.0)
MCH: 30.8 pg (ref 26.0–34.0)
MCHC: 34 g/dL (ref 30.0–36.0)
MCV: 90.5 fL (ref 80.0–100.0)
Platelets: 207 10*3/uL (ref 150–400)
RBC: 4.65 MIL/uL (ref 4.22–5.81)
RDW: 13.2 % (ref 11.5–15.5)
WBC: 8.1 10*3/uL (ref 4.0–10.5)
nRBC: 0 % (ref 0.0–0.2)

## 2020-11-01 NOTE — Progress Notes (Signed)
COVID Vaccine Completed:N Date COVID Vaccine completed: COVID vaccine manufacturer: Pfizer    Moderna   [employer]   PCP - Dr. Georges Mouse Cardiologist - none  Chest x-ray - no EKG - 09/11/20-epic Stress Test - no ECHO - no Cardiac Cath no-  Pacemaker/ICD device last checked:NA  Sleep Study - no CPAP -   Fasting Blood Sugar - NA Checks Blood Sugar _____ times a day  Blood Thinner Instructions:NA Aspirin Instructions: Last Dose:  Anesthesia review:   Patient denies shortness of breath, fever, cough and chest pain at PAT appointment yes  Patient verbalized understanding of instructions that were given to them at the PAT appointment. Patient was also instructed that they will need to review over the PAT instructions again at home before surgery.yes Pt is able to climb stairs, work around the house and ADLs.

## 2020-11-06 ENCOUNTER — Other Ambulatory Visit (HOSPITAL_COMMUNITY)
Admission: RE | Admit: 2020-11-06 | Discharge: 2020-11-06 | Disposition: A | Discharge status: Home / Self Care | Payer: 59 | Source: Ambulatory Visit | Attending: Vascular Surgery | Admitting: Vascular Surgery

## 2020-11-06 DIAGNOSIS — Z20822 Contact with and (suspected) exposure to covid-19: Secondary | ICD-10-CM | POA: Insufficient documentation

## 2020-11-06 DIAGNOSIS — Z01812 Encounter for preprocedural laboratory examination: Secondary | ICD-10-CM | POA: Insufficient documentation

## 2020-11-07 LAB — SARS CORONAVIRUS 2 (TAT 6-24 HRS): SARS Coronavirus 2: NEGATIVE

## 2020-11-08 NOTE — Anesthesia Preprocedure Evaluation (Addendum)
Anesthesia Evaluation  Patient identified by MRN, date of birth, ID band Patient awake    Reviewed: Allergy & Precautions, NPO status , Patient's Chart, lab work & pertinent test results  History of Anesthesia Complications Negative for: history of anesthetic complications  Airway Mallampati: II  TM Distance: >3 FB Neck ROM: Full    Dental  (+) Dental Advisory Given, Teeth Intact   Pulmonary neg pulmonary ROS,    Pulmonary exam normal        Cardiovascular hypertension, Pt. on medications Normal cardiovascular exam     Neuro/Psych  Headaches, PSYCHIATRIC DISORDERS Anxiety Depression    GI/Hepatic negative GI ROS, Neg liver ROS,   Endo/Other   Obesity   Renal/GU negative Renal ROS     Musculoskeletal negative musculoskeletal ROS (+)   Abdominal   Peds  Hematology negative hematology ROS (+)   Anesthesia Other Findings Covid test negative   Reproductive/Obstetrics                           Anesthesia Physical Anesthesia Plan  ASA: II  Anesthesia Plan: General   Post-op Pain Management:    Induction: Intravenous  PONV Risk Score and Plan: 4 or greater and Treatment may vary due to age or medical condition, Ondansetron, Midazolam, Scopolamine patch - Pre-op and Dexamethasone  Airway Management Planned: Oral ETT  Additional Equipment: None  Intra-op Plan:   Post-operative Plan: Extubation in OR  Informed Consent: I have reviewed the patients History and Physical, chart, labs and discussed the procedure including the risks, benefits and alternatives for the proposed anesthesia with the patient or authorized representative who has indicated his/her understanding and acceptance.     Dental advisory given  Plan Discussed with: CRNA and Anesthesiologist  Anesthesia Plan Comments:        Anesthesia Quick Evaluation

## 2020-11-09 ENCOUNTER — Other Ambulatory Visit: Payer: Self-pay

## 2020-11-09 ENCOUNTER — Inpatient Hospital Stay (HOSPITAL_COMMUNITY): Payer: 59 | Admitting: Anesthesiology

## 2020-11-09 ENCOUNTER — Encounter (HOSPITAL_COMMUNITY)
Admission: RE | Disposition: A | Discharge status: Home / Self Care | Payer: Self-pay | Source: Home / Self Care | Attending: General Surgery

## 2020-11-09 ENCOUNTER — Encounter (HOSPITAL_COMMUNITY): Payer: Self-pay | Admitting: General Surgery

## 2020-11-09 ENCOUNTER — Inpatient Hospital Stay (HOSPITAL_COMMUNITY)
Admission: RE | Admit: 2020-11-09 | Discharge: 2020-11-11 | DRG: 331 | Disposition: A | Discharge status: Home / Self Care | Payer: 59 | Attending: Surgery | Admitting: Surgery

## 2020-11-09 DIAGNOSIS — F1990 Other psychoactive substance use, unspecified, uncomplicated: Secondary | ICD-10-CM | POA: Diagnosis present

## 2020-11-09 DIAGNOSIS — I129 Hypertensive chronic kidney disease with stage 1 through stage 4 chronic kidney disease, or unspecified chronic kidney disease: Secondary | ICD-10-CM | POA: Diagnosis present

## 2020-11-09 DIAGNOSIS — F419 Anxiety disorder, unspecified: Secondary | ICD-10-CM | POA: Diagnosis present

## 2020-11-09 DIAGNOSIS — F32A Depression, unspecified: Secondary | ICD-10-CM | POA: Diagnosis present

## 2020-11-09 DIAGNOSIS — I10 Essential (primary) hypertension: Secondary | ICD-10-CM | POA: Diagnosis present

## 2020-11-09 DIAGNOSIS — C180 Malignant neoplasm of cecum: Secondary | ICD-10-CM | POA: Diagnosis present

## 2020-11-09 DIAGNOSIS — N189 Chronic kidney disease, unspecified: Secondary | ICD-10-CM | POA: Diagnosis present

## 2020-11-09 DIAGNOSIS — Z811 Family history of alcohol abuse and dependence: Secondary | ICD-10-CM

## 2020-11-09 DIAGNOSIS — Z20822 Contact with and (suspected) exposure to covid-19: Secondary | ICD-10-CM | POA: Diagnosis present

## 2020-11-09 DIAGNOSIS — R519 Headache, unspecified: Secondary | ICD-10-CM | POA: Diagnosis present

## 2020-11-09 DIAGNOSIS — Z8249 Family history of ischemic heart disease and other diseases of the circulatory system: Secondary | ICD-10-CM

## 2020-11-09 DIAGNOSIS — D122 Benign neoplasm of ascending colon: Principal | ICD-10-CM | POA: Diagnosis present

## 2020-11-09 DIAGNOSIS — Z8261 Family history of arthritis: Secondary | ICD-10-CM | POA: Diagnosis not present

## 2020-11-09 DIAGNOSIS — Z818 Family history of other mental and behavioral disorders: Secondary | ICD-10-CM

## 2020-11-09 DIAGNOSIS — Z87891 Personal history of nicotine dependence: Secondary | ICD-10-CM | POA: Diagnosis not present

## 2020-11-09 DIAGNOSIS — Z8042 Family history of malignant neoplasm of prostate: Secondary | ICD-10-CM | POA: Diagnosis not present

## 2020-11-09 DIAGNOSIS — K635 Polyp of colon: Secondary | ICD-10-CM | POA: Diagnosis present

## 2020-11-09 DIAGNOSIS — Z8371 Family history of colonic polyps: Secondary | ICD-10-CM | POA: Diagnosis not present

## 2020-11-09 DIAGNOSIS — K6389 Other specified diseases of intestine: Secondary | ICD-10-CM | POA: Diagnosis present

## 2020-11-09 LAB — TYPE AND SCREEN
ABO/RH(D): A POS
Antibody Screen: NEGATIVE

## 2020-11-09 LAB — ABO/RH: ABO/RH(D): A POS

## 2020-11-09 SURGERY — COLECTOMY, PARTIAL, ROBOT-ASSISTED, LAPAROSCOPIC
Anesthesia: General | Site: Abdomen

## 2020-11-09 MED ORDER — CHLORHEXIDINE GLUCONATE 0.12 % MT SOLN
15.0000 mL | Freq: Once | OROMUCOSAL | Status: AC
  Administered 2020-11-09: 15 mL via OROMUCOSAL

## 2020-11-09 MED ORDER — ENSURE PRE-SURGERY PO LIQD
296.0000 mL | Freq: Once | ORAL | Status: DC
  Filled 2020-11-09: qty 296

## 2020-11-09 MED ORDER — ONDANSETRON HCL 4 MG/2ML IJ SOLN
INTRAMUSCULAR | Status: DC | PRN
  Administered 2020-11-09: 4 mg via INTRAVENOUS

## 2020-11-09 MED ORDER — FENTANYL CITRATE (PF) 100 MCG/2ML IJ SOLN: 25.0000 ug | INTRAMUSCULAR | Status: DC | PRN

## 2020-11-09 MED ORDER — ALUM & MAG HYDROXIDE-SIMETH 200-200-20 MG/5ML PO SUSP: 30.0000 mL | Freq: Four times a day (QID) | ORAL | Status: DC | PRN

## 2020-11-09 MED ORDER — BUPIVACAINE-EPINEPHRINE 0.25% -1:200000 IJ SOLN
INTRAMUSCULAR | Status: DC | PRN
  Administered 2020-11-09: 30 mL

## 2020-11-09 MED ORDER — PROPOFOL 10 MG/ML IV BOLUS
INTRAVENOUS | Status: AC
  Filled 2020-11-09: qty 20

## 2020-11-09 MED ORDER — KETAMINE HCL 10 MG/ML IJ SOLN
INTRAMUSCULAR | Status: AC
  Filled 2020-11-09: qty 1

## 2020-11-09 MED ORDER — KETAMINE HCL 10 MG/ML IJ SOLN
INTRAMUSCULAR | Status: DC | PRN
  Administered 2020-11-09 (×2): 40 mg via INTRAVENOUS

## 2020-11-09 MED ORDER — PROMETHAZINE HCL 25 MG/ML IJ SOLN
6.2500 mg | INTRAMUSCULAR | Status: DC | PRN
Start: 2020-11-09 — End: 2020-11-09

## 2020-11-09 MED ORDER — BUPIVACAINE-EPINEPHRINE (PF) 0.25% -1:200000 IJ SOLN
INTRAMUSCULAR | Status: AC
  Filled 2020-11-09: qty 30

## 2020-11-09 MED ORDER — DEXAMETHASONE SODIUM PHOSPHATE 10 MG/ML IJ SOLN
INTRAMUSCULAR | Status: AC
  Filled 2020-11-09: qty 1

## 2020-11-09 MED ORDER — LISINOPRIL 20 MG PO TABS
20.0000 mg | ORAL_TABLET | Freq: Every evening | ORAL | Status: DC
  Administered 2020-11-10: 20 mg via ORAL
  Filled 2020-11-09: qty 1

## 2020-11-09 MED ORDER — BUPIVACAINE LIPOSOME 1.3 % IJ SUSP
INTRAMUSCULAR | Status: DC | PRN
  Administered 2020-11-09: 20 mL

## 2020-11-09 MED ORDER — LIDOCAINE 2% (20 MG/ML) 5 ML SYRINGE
INTRAMUSCULAR | Status: AC
  Filled 2020-11-09: qty 5

## 2020-11-09 MED ORDER — ORAL CARE MOUTH RINSE: 15.0000 mL | Freq: Once | OROMUCOSAL | Status: AC

## 2020-11-09 MED ORDER — PROPOFOL 10 MG/ML IV BOLUS
INTRAVENOUS | Status: DC | PRN
  Administered 2020-11-09: 200 mg via INTRAVENOUS

## 2020-11-09 MED ORDER — DIPHENHYDRAMINE HCL 25 MG PO CAPS: 25.0000 mg | ORAL_CAPSULE | Freq: Four times a day (QID) | ORAL | Status: DC | PRN

## 2020-11-09 MED ORDER — 0.9 % SODIUM CHLORIDE (POUR BTL) OPTIME
TOPICAL | Status: DC | PRN
  Administered 2020-11-09: 1000 mL

## 2020-11-09 MED ORDER — ALVIMOPAN 12 MG PO CAPS
12.0000 mg | ORAL_CAPSULE | ORAL | Status: AC
  Administered 2020-11-09: 12 mg via ORAL
  Filled 2020-11-09: qty 1

## 2020-11-09 MED ORDER — LIDOCAINE HCL (PF) 2 % IJ SOLN
INTRAMUSCULAR | Status: DC | PRN
  Administered 2020-11-09: 1 mg/kg/h via INTRADERMAL

## 2020-11-09 MED ORDER — SACCHAROMYCES BOULARDII 250 MG PO CAPS
250.0000 mg | ORAL_CAPSULE | Freq: Two times a day (BID) | ORAL | Status: DC
  Administered 2020-11-09 – 2020-11-10 (×3): 250 mg via ORAL
  Filled 2020-11-09 (×3): qty 1

## 2020-11-09 MED ORDER — MIDAZOLAM HCL 5 MG/5ML IJ SOLN
INTRAMUSCULAR | Status: DC | PRN
  Administered 2020-11-09: 2 mg via INTRAVENOUS

## 2020-11-09 MED ORDER — GABAPENTIN 300 MG PO CAPS
300.0000 mg | ORAL_CAPSULE | Freq: Two times a day (BID) | ORAL | Status: DC
  Administered 2020-11-09 – 2020-11-11 (×5): 300 mg via ORAL
  Filled 2020-11-09 (×5): qty 1

## 2020-11-09 MED ORDER — ONDANSETRON HCL 4 MG PO TABS: 4.0000 mg | ORAL_TABLET | Freq: Four times a day (QID) | ORAL | Status: DC | PRN

## 2020-11-09 MED ORDER — FENTANYL CITRATE (PF) 100 MCG/2ML IJ SOLN
INTRAMUSCULAR | Status: AC
  Filled 2020-11-09: qty 2

## 2020-11-09 MED ORDER — LACTATED RINGERS IV SOLN: INTRAVENOUS | Status: DC

## 2020-11-09 MED ORDER — ACETAMINOPHEN 500 MG PO TABS
1000.0000 mg | ORAL_TABLET | Freq: Four times a day (QID) | ORAL | Status: DC
  Administered 2020-11-09 – 2020-11-11 (×7): 1000 mg via ORAL
  Filled 2020-11-09 (×7): qty 2

## 2020-11-09 MED ORDER — ESMOLOL HCL 100 MG/10ML IV SOLN
INTRAVENOUS | Status: AC
  Filled 2020-11-09: qty 10

## 2020-11-09 MED ORDER — ROCURONIUM BROMIDE 10 MG/ML (PF) SYRINGE
PREFILLED_SYRINGE | INTRAVENOUS | Status: AC
  Filled 2020-11-09: qty 10

## 2020-11-09 MED ORDER — SODIUM CHLORIDE 0.9 % IV SOLN
2.0000 g | INTRAVENOUS | Status: AC
  Administered 2020-11-09: 2 g via INTRAVENOUS
  Filled 2020-11-09: qty 2

## 2020-11-09 MED ORDER — OXYCODONE HCL 5 MG PO TABS: 5.0000 mg | ORAL_TABLET | Freq: Once | ORAL | Status: DC | PRN

## 2020-11-09 MED ORDER — HYDROCHLOROTHIAZIDE 12.5 MG PO CAPS
12.5000 mg | ORAL_CAPSULE | Freq: Every evening | ORAL | Status: DC
  Administered 2020-11-10: 12.5 mg via ORAL
  Filled 2020-11-09: qty 1

## 2020-11-09 MED ORDER — ESMOLOL HCL 100 MG/10ML IV SOLN
INTRAVENOUS | Status: DC | PRN
  Administered 2020-11-09: 30 mg via INTRAVENOUS

## 2020-11-09 MED ORDER — ONDANSETRON HCL 4 MG/2ML IJ SOLN: 4.0000 mg | Freq: Four times a day (QID) | INTRAMUSCULAR | Status: DC | PRN

## 2020-11-09 MED ORDER — LACTATED RINGERS IR SOLN
Status: DC | PRN
  Administered 2020-11-09: 1000 mL

## 2020-11-09 MED ORDER — MIRTAZAPINE 15 MG PO TABS
15.0000 mg | ORAL_TABLET | Freq: Every day | ORAL | Status: DC
  Administered 2020-11-09 – 2020-11-10 (×2): 15 mg via ORAL
  Filled 2020-11-09 (×2): qty 1

## 2020-11-09 MED ORDER — GABAPENTIN 300 MG PO CAPS
300.0000 mg | ORAL_CAPSULE | ORAL | Status: AC
  Administered 2020-11-09: 300 mg via ORAL
  Filled 2020-11-09: qty 1

## 2020-11-09 MED ORDER — DIPHENHYDRAMINE HCL 50 MG/ML IJ SOLN: 25.0000 mg | Freq: Four times a day (QID) | INTRAMUSCULAR | Status: DC | PRN

## 2020-11-09 MED ORDER — SODIUM CHLORIDE 0.9 % IV SOLN
2.0000 g | Freq: Two times a day (BID) | INTRAVENOUS | Status: AC
  Administered 2020-11-09: 2 g via INTRAVENOUS
  Filled 2020-11-09: qty 2

## 2020-11-09 MED ORDER — FENTANYL CITRATE (PF) 100 MCG/2ML IJ SOLN
INTRAMUSCULAR | Status: DC | PRN
  Administered 2020-11-09: 100 ug via INTRAVENOUS
  Administered 2020-11-09 (×2): 50 ug via INTRAVENOUS

## 2020-11-09 MED ORDER — HYDROMORPHONE HCL 1 MG/ML IJ SOLN
0.5000 mg | INTRAMUSCULAR | Status: DC | PRN
  Administered 2020-11-09: 0.5 mg via INTRAVENOUS
  Filled 2020-11-09: qty 0.5

## 2020-11-09 MED ORDER — METOPROLOL TARTRATE 5 MG/5ML IV SOLN
INTRAVENOUS | Status: AC
  Filled 2020-11-09: qty 5

## 2020-11-09 MED ORDER — SUGAMMADEX SODIUM 200 MG/2ML IV SOLN
INTRAVENOUS | Status: DC | PRN
  Administered 2020-11-09: 200 mg via INTRAVENOUS

## 2020-11-09 MED ORDER — ROCURONIUM BROMIDE 10 MG/ML (PF) SYRINGE
PREFILLED_SYRINGE | INTRAVENOUS | Status: DC | PRN
  Administered 2020-11-09: 60 mg via INTRAVENOUS
  Administered 2020-11-09: 30 mg via INTRAVENOUS

## 2020-11-09 MED ORDER — SIMETHICONE 80 MG PO CHEW: 40.0000 mg | CHEWABLE_TABLET | Freq: Four times a day (QID) | ORAL | Status: DC | PRN

## 2020-11-09 MED ORDER — MIDAZOLAM HCL 2 MG/2ML IJ SOLN
INTRAMUSCULAR | Status: AC
  Filled 2020-11-09: qty 2

## 2020-11-09 MED ORDER — ACETAMINOPHEN 500 MG PO TABS
1000.0000 mg | ORAL_TABLET | ORAL | Status: AC
  Administered 2020-11-09: 1000 mg via ORAL
  Filled 2020-11-09: qty 2

## 2020-11-09 MED ORDER — OXYCODONE HCL 5 MG/5ML PO SOLN: 5.0000 mg | Freq: Once | ORAL | Status: DC | PRN

## 2020-11-09 MED ORDER — ONDANSETRON HCL 4 MG/2ML IJ SOLN
INTRAMUSCULAR | Status: AC
  Filled 2020-11-09: qty 2

## 2020-11-09 MED ORDER — ENSURE SURGERY PO LIQD
237.0000 mL | Freq: Two times a day (BID) | ORAL | Status: DC
  Administered 2020-11-09 – 2020-11-10 (×3): 237 mL via ORAL

## 2020-11-09 MED ORDER — ENSURE PRE-SURGERY PO LIQD
592.0000 mL | Freq: Once | ORAL | Status: DC
  Filled 2020-11-09: qty 592

## 2020-11-09 MED ORDER — PHENYLEPHRINE 40 MCG/ML (10ML) SYRINGE FOR IV PUSH (FOR BLOOD PRESSURE SUPPORT)
PREFILLED_SYRINGE | INTRAVENOUS | Status: DC | PRN
  Administered 2020-11-09 (×2): 80 ug via INTRAVENOUS

## 2020-11-09 MED ORDER — LIDOCAINE 2% (20 MG/ML) 5 ML SYRINGE
INTRAMUSCULAR | Status: AC
  Filled 2020-11-09: qty 10

## 2020-11-09 MED ORDER — DEXAMETHASONE SODIUM PHOSPHATE 10 MG/ML IJ SOLN
INTRAMUSCULAR | Status: DC | PRN
  Administered 2020-11-09: 10 mg via INTRAVENOUS

## 2020-11-09 MED ORDER — LISINOPRIL-HYDROCHLOROTHIAZIDE 20-12.5 MG PO TABS: 1.0000 | ORAL_TABLET | Freq: Every evening | ORAL | Status: DC

## 2020-11-09 MED ORDER — ENOXAPARIN SODIUM 40 MG/0.4ML ~~LOC~~ SOLN
40.0000 mg | SUBCUTANEOUS | Status: DC
  Administered 2020-11-10 – 2020-11-11 (×2): 40 mg via SUBCUTANEOUS
  Filled 2020-11-09 (×2): qty 0.4

## 2020-11-09 MED ORDER — METOPROLOL TARTRATE 5 MG/5ML IV SOLN
INTRAVENOUS | Status: DC | PRN
  Administered 2020-11-09: 3 mg via INTRAVENOUS

## 2020-11-09 MED ORDER — ALVIMOPAN 12 MG PO CAPS: 12.0000 mg | ORAL_CAPSULE | Freq: Two times a day (BID) | ORAL | Status: DC

## 2020-11-09 MED ORDER — PAROXETINE HCL 20 MG PO TABS
20.0000 mg | ORAL_TABLET | Freq: Every evening | ORAL | Status: DC
  Administered 2020-11-09 – 2020-11-10 (×2): 20 mg via ORAL
  Filled 2020-11-09 (×2): qty 1

## 2020-11-09 MED ORDER — LIDOCAINE 2% (20 MG/ML) 5 ML SYRINGE
INTRAMUSCULAR | Status: DC | PRN
  Administered 2020-11-09: 60 mg via INTRAVENOUS

## 2020-11-09 MED ORDER — KCL IN DEXTROSE-NACL 20-5-0.45 MEQ/L-%-% IV SOLN
INTRAVENOUS | Status: DC
  Filled 2020-11-09 (×2): qty 1000

## 2020-11-09 SURGICAL SUPPLY — 89 items
BLADE EXTENDED COATED 6.5IN (ELECTRODE) IMPLANT
CANNULA REDUC XI 12-8 STAPL (CANNULA) ×2
CANNULA REDUCER 12-8 DVNC XI (CANNULA) ×1 IMPLANT
CELLS DAT CNTRL 66122 CELL SVR (MISCELLANEOUS) ×1 IMPLANT
COVER SURGICAL LIGHT HANDLE (MISCELLANEOUS) ×2 IMPLANT
COVER TIP SHEARS 8 DVNC (MISCELLANEOUS) ×1 IMPLANT
COVER TIP SHEARS 8MM DA VINCI (MISCELLANEOUS) ×2
COVER WAND RF STERILE (DRAPES) IMPLANT
DECANTER SPIKE VIAL GLASS SM (MISCELLANEOUS) ×2 IMPLANT
DERMABOND ADVANCED (GAUZE/BANDAGES/DRESSINGS) ×1
DERMABOND ADVANCED .7 DNX12 (GAUZE/BANDAGES/DRESSINGS) ×1 IMPLANT
DRAIN CHANNEL 19F RND (DRAIN) IMPLANT
DRAPE ARM DVNC X/XI (DISPOSABLE) ×4 IMPLANT
DRAPE COLUMN DVNC XI (DISPOSABLE) ×1 IMPLANT
DRAPE DA VINCI XI ARM (DISPOSABLE) ×8
DRAPE DA VINCI XI COLUMN (DISPOSABLE) ×2
DRAPE SURG IRRIG POUCH 19X23 (DRAPES) ×2 IMPLANT
DRSG OPSITE POSTOP 4X10 (GAUZE/BANDAGES/DRESSINGS) IMPLANT
DRSG OPSITE POSTOP 4X6 (GAUZE/BANDAGES/DRESSINGS) ×2 IMPLANT
DRSG OPSITE POSTOP 4X8 (GAUZE/BANDAGES/DRESSINGS) IMPLANT
ELECT PENCIL ROCKER SW 15FT (MISCELLANEOUS) ×2 IMPLANT
ELECT REM PT RETURN 15FT ADLT (MISCELLANEOUS) ×2 IMPLANT
ENDOLOOP SUT PDS II  0 18 (SUTURE)
ENDOLOOP SUT PDS II 0 18 (SUTURE) IMPLANT
EVACUATOR SILICONE 100CC (DRAIN) IMPLANT
GLOVE SURG ENC MOIS LTX SZ6.5 (GLOVE) ×6 IMPLANT
GLOVE SURG UNDER POLY LF SZ7 (GLOVE) ×4 IMPLANT
GOWN STRL REUS W/TWL XL LVL3 (GOWN DISPOSABLE) ×6 IMPLANT
GRASPER SUT TROCAR 14GX15 (MISCELLANEOUS) IMPLANT
HOLDER FOLEY CATH W/STRAP (MISCELLANEOUS) ×2 IMPLANT
IRRIG SUCT STRYKERFLOW 2 WTIP (MISCELLANEOUS) ×2
IRRIGATION SUCT STRKRFLW 2 WTP (MISCELLANEOUS) ×1 IMPLANT
KIT PROCEDURE DA VINCI SI (MISCELLANEOUS)
KIT PROCEDURE DVNC SI (MISCELLANEOUS) IMPLANT
KIT TURNOVER KIT A (KITS) ×2 IMPLANT
NEEDLE INSUFFLATION 14GA 120MM (NEEDLE) ×2 IMPLANT
PACK CARDIOVASCULAR III (CUSTOM PROCEDURE TRAY) ×2 IMPLANT
PACK COLON (CUSTOM PROCEDURE TRAY) ×2 IMPLANT
PAD POSITIONING PINK XL (MISCELLANEOUS) ×2 IMPLANT
PORT LAP GEL ALEXIS MED 5-9CM (MISCELLANEOUS) IMPLANT
RELOAD STAPLER 2.5X60 WHT DVNC (STAPLE) ×1 IMPLANT
RELOAD STAPLER 3.5X60 BLU DVNC (STAPLE) ×1 IMPLANT
RELOAD STAPLER 4.3X60 GRN DVNC (STAPLE) ×2 IMPLANT
RTRCTR WOUND ALEXIS 18CM MED (MISCELLANEOUS) ×2
SCISSORS LAP 5X35 DISP (ENDOMECHANICALS) IMPLANT
SEAL CANN UNIV 5-8 DVNC XI (MISCELLANEOUS) ×3 IMPLANT
SEAL XI 5MM-8MM UNIVERSAL (MISCELLANEOUS) ×6
SEALER VESSEL DA VINCI XI (MISCELLANEOUS) ×2
SEALER VESSEL EXT DVNC XI (MISCELLANEOUS) ×1 IMPLANT
SOLUTION ELECTROLUBE (MISCELLANEOUS) ×2 IMPLANT
STAPLER 60 DA VINCI SURE FORM (STAPLE)
STAPLER 60 SUREFORM DVNC (STAPLE) IMPLANT
STAPLER CANNULA SEAL DVNC XI (STAPLE) ×1 IMPLANT
STAPLER CANNULA SEAL XI (STAPLE) ×2
STAPLER ECHELON POWER CIR 29 (STAPLE) IMPLANT
STAPLER ECHELON POWER CIR 31 (STAPLE) IMPLANT
STAPLER RELOAD 2.5X60 WHITE (STAPLE) ×2
STAPLER RELOAD 2.5X60 WHT DVNC (STAPLE) ×1
STAPLER RELOAD 3.5X60 BLU DVNC (STAPLE) ×1
STAPLER RELOAD 3.5X60 BLUE (STAPLE) ×2
STAPLER RELOAD 4.3X60 GREEN (STAPLE) ×4
STAPLER RELOAD 4.3X60 GRN DVNC (STAPLE) ×2
STOPCOCK 4 WAY LG BORE MALE ST (IV SETS) ×4 IMPLANT
SUT ETHILON 2 0 PS N (SUTURE) IMPLANT
SUT NOVA NAB DX-16 0-1 5-0 T12 (SUTURE) ×4 IMPLANT
SUT PROLENE 2 0 KS (SUTURE) IMPLANT
SUT SILK 2 0 (SUTURE) ×2
SUT SILK 2 0 SH CR/8 (SUTURE) IMPLANT
SUT SILK 2-0 18XBRD TIE 12 (SUTURE) ×1 IMPLANT
SUT SILK 3 0 (SUTURE)
SUT SILK 3 0 SH CR/8 (SUTURE) ×2 IMPLANT
SUT SILK 3-0 18XBRD TIE 12 (SUTURE) IMPLANT
SUT V-LOC BARB 180 2/0GR6 GS22 (SUTURE) ×4
SUT VIC AB 2-0 SH 18 (SUTURE) IMPLANT
SUT VIC AB 2-0 SH 27 (SUTURE) ×2
SUT VIC AB 2-0 SH 27X BRD (SUTURE) ×1 IMPLANT
SUT VIC AB 3-0 SH 18 (SUTURE) IMPLANT
SUT VIC AB 4-0 PS2 27 (SUTURE) ×4 IMPLANT
SUT VICRYL 0 UR6 27IN ABS (SUTURE) ×2 IMPLANT
SUTURE V-LC BRB 180 2/0GR6GS22 (SUTURE) ×2 IMPLANT
SYR 10ML ECCENTRIC (SYRINGE) ×2 IMPLANT
SYS LAPSCP GELPORT 120MM (MISCELLANEOUS)
SYSTEM LAPSCP GELPORT 120MM (MISCELLANEOUS) IMPLANT
TOWEL OR 17X26 10 PK STRL BLUE (TOWEL DISPOSABLE) IMPLANT
TOWEL OR NON WOVEN STRL DISP B (DISPOSABLE) ×2 IMPLANT
TRAY FOLEY MTR SLVR 16FR STAT (SET/KITS/TRAYS/PACK) ×2 IMPLANT
TROCAR ADV FIXATION 5X100MM (TROCAR) ×2 IMPLANT
TUBING CONNECTING 10 (TUBING) ×2 IMPLANT
TUBING INSUFFLATION 10FT LAP (TUBING) ×2 IMPLANT

## 2020-11-09 NOTE — Discharge Instructions (Signed)

## 2020-11-09 NOTE — Transfer of Care (Signed)
Immediate Anesthesia Transfer of Care Note  Patient: Simuel Stebner  Procedure(s) Performed: Procedure(s): ROBOTIC RIGHT COLECTOMY (N/A)  Patient Location: PACU  Anesthesia Type:General  Level of Consciousness: Alert, Awake, Oriented  Airway & Oxygen Therapy: Patient Spontanous Breathing  Post-op Assessment: Report given to RN  Post vital signs: Reviewed and stable  Last Vitals:  Vitals:   11/09/20 0546  BP: (!) 162/104  Pulse: 95  Resp: 17  Temp: 36.7 C  SpO2: 48%    Complications: No apparent anesthesia complications

## 2020-11-09 NOTE — Anesthesia Procedure Notes (Signed)
Procedure Name: Intubation Date/Time: 11/09/2020 7:31 AM Performed by: Gerald Leitz, CRNA Pre-anesthesia Checklist: Patient identified, Patient being monitored, Timeout performed, Emergency Drugs available and Suction available Patient Re-evaluated:Patient Re-evaluated prior to induction Oxygen Delivery Method: Circle system utilized Preoxygenation: Pre-oxygenation with 100% oxygen Induction Type: IV induction Ventilation: Mask ventilation without difficulty Laryngoscope Size: Mac and 3 Grade View: Grade I Tube type: Oral Tube size: 7.5 mm Number of attempts: 1 Placement Confirmation: ETT inserted through vocal cords under direct vision,  positive ETCO2 and breath sounds checked- equal and bilateral Secured at: 21 cm Tube secured with: Tape Dental Injury: Teeth and Oropharynx as per pre-operative assessment

## 2020-11-09 NOTE — H&P (Signed)
The patient is a 50 year old male who presents with a colonic polyp. 50 year old male who presents to the office for evaluation of an endoscopically unresectable polyp of ascending colon noted to be near the cecum. This was seen on colonoscopy, where several other polyps were removed. 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 Patient has no past surgical history within his abdomen. This was his first colonoscopy. He denies any irregular bowel habits, or chronic diarrhea. He has been having some rectal bleeding recently.   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  Diagnostic Studies History Leighton Ruff, MD; 70/09/7791 10:45 AM) Colonoscopy within last year  Allergies Mammie Lorenzo, LPN; 90/10/90 33:00 AM) Statins  Medication History Leighton Ruff, MD; 76/10/2631 10:45 AM) Lisinopril-hydroCHLOROthiazide (20-12.5MG  Tablet, Oral) Active. Mirtazapine (15MG  Tablet, Oral) Active. PARoxetine HCl (20MG  Tablet, Oral) Active. Pravastatin Sodium (10MG  Tablet, Oral) Active. Fish Oil (1000MG  Capsule, Oral) Active.   Social History Leighton Ruff, MD; 35/12/5623 10:45 AM) Alcohol use Remotely quit alcohol use. Caffeine use Carbonated beverages. Illicit drug use Uses daily. Tobacco use Former smoker.  Family History Leighton Ruff, MD; 63/04/9372 10:45 AM) Alcohol Abuse Brother. Arthritis Mother. Colon Polyps Father. Depression Mother. Hypertension Mother. Prostate Cancer Father. Respiratory Condition Father.  Other Problems Leighton Ruff, MD; 42/04/7680 10:45 AM) Anxiety Disorder Back Pain Depression High blood pressure Hypercholesterolemia Kidney Stone     Review of Systems  General Not Present- Appetite Loss, Chills,  Fatigue, Fever, Night Sweats, Weight Gain and Weight Loss. Note: All other systems negative (unless as noted in HPI & included Review of Systems) Skin Not Present- Change in Wart/Mole, Dryness, Hives, Jaundice, New Lesions, Non-Healing Wounds, Rash and Ulcer. HEENT Not Present- Earache, Hearing Loss, Hoarseness, Nose Bleed, Oral Ulcers, Ringing in the Ears, Seasonal Allergies, Sinus Pain, Sore Throat, Visual Disturbances, Wears glasses/contact lenses and Yellow Eyes. Respiratory Not Present- Bloody sputum, Chronic Cough, Difficulty Breathing, Snoring and Wheezing. Breast Not Present- Breast Mass, Breast Pain, Nipple Discharge and Skin Changes. Cardiovascular Not Present- Chest Pain, Difficulty Breathing Lying Down, Leg Cramps, Palpitations, Rapid Heart Rate, Shortness of Breath and Swelling of Extremities. Gastrointestinal Present- Bloody Stool. Not Present- Abdominal Pain, Bloating, Change in Bowel Habits, Chronic diarrhea, Constipation, Difficulty Swallowing, Excessive gas, Gets full quickly at meals, Hemorrhoids, Indigestion, Nausea, Rectal Pain and Vomiting. Male Genitourinary Not Present- Blood in Urine, Change in Urinary Stream, Frequency, Impotence, Nocturia, Painful Urination, Urgency and Urine Leakage. Musculoskeletal Not Present- Back Pain, Joint Pain, Joint Stiffness, Muscle Pain, Muscle Weakness and Swelling of Extremities. Neurological Not Present- Decreased Memory, Fainting, Headaches, Numbness, Seizures, Tingling, Tremor, Trouble walking and Weakness. Psychiatric Not Present- Anxiety, Bipolar, Change in Sleep Pattern, Depression, Fearful and Frequent crying. Endocrine Not Present- Cold Intolerance, Excessive Hunger, Hair Changes, Heat Intolerance and New Diabetes. Hematology Not Present- Easy Bruising, Excessive bleeding, Gland problems, HIV and Persistent Infections.  BP (!) 162/104   Pulse 95   Temp 98 F (36.7 C) (Oral)   Resp 17   Ht 6\' 3"  (1.905 m)   Wt 123.4 kg   SpO2 97%    BMI 34.00 kg/m       Physical Exam   General Mental Status-Alert. General Appearance-Cooperative.  Abdomen Palpation/Percussion Palpation and Percussion of the abdomen reveal - Soft and Non Tender.    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 This described as carpeting and covering approximately 50% of the surface. Patient has no past surgical history. I have recommended a robotic-assisted right colectomy. We have discussed this in detail including the risk of the polyp harboring a malignancy. All questions were answered. The surgery and anatomy were described to the patient as well as the risks of surgery and the possible complications. These include: Bleeding, deep abdominal infections and possible wound complications such as hernia and infection, damage to adjacent structures, leak of surgical connections, which can lead to other surgeries and possibly an ostomy, possible need for other procedures, such as abscess drains in radiology, possible prolonged hospital stay, possible diarrhea from removal of part of the colon, possible constipation from narcotics, possible bowel, bladder or sexual dysfunction if having rectal surgery, prolonged fatigue/weakness or appetite loss, possible early recurrence of of disease, possible complications of their medical problems such as heart disease or arrhythmias or lung problems, death (less than 1%). I believe the patient understands and wishes to proceed with the surgery.

## 2020-11-09 NOTE — Anesthesia Postprocedure Evaluation (Signed)
Anesthesia Post Note  Patient: Corey Gregory  Procedure(s) Performed: ROBOTIC RIGHT COLECTOMY (N/A Abdomen)     Patient location during evaluation: PACU Anesthesia Type: General Level of consciousness: awake and alert Pain management: pain level controlled Vital Signs Assessment: post-procedure vital signs reviewed and stable Respiratory status: spontaneous breathing, nonlabored ventilation, respiratory function stable and patient connected to nasal cannula oxygen Cardiovascular status: blood pressure returned to baseline and stable Postop Assessment: no apparent nausea or vomiting Anesthetic complications: no   No complications documented.  Last Vitals:  Vitals:   11/09/20 1200 11/09/20 1230  BP: (!) 144/97 (!) 142/90  Pulse: 92 89  Resp: 11   Temp: 36.4 C 36.6 C  SpO2: 96%     Last Pain:  Vitals:   11/09/20 1230  TempSrc: Oral  PainSc:                  [name]

## 2020-11-09 NOTE — Op Note (Signed)
11/09/2020  9:43 AM  PATIENT:  Corey Gregory  50 y.o. male  Patient Care Team: Jani Gravel, MD as PCP - General (Internal Medicine)  PRE-OPERATIVE DIAGNOSIS:  cecal polyp  POST-OPERATIVE DIAGNOSIS:  cecal polyp  PROCEDURE:  ROBOTIC ASCENDING COLECTOMY  SURGEON:  Surgeon(s): Leighton Ruff, MD Michael Boston, MD  ASSISTANT: Dr Johney Maine   ANESTHESIA:   local and general  EBL:  Total I/O In: 1100 [I.V.:1000; IV Piggyback:100] Out: -   SPECIMEN:  Source of Specimen:  R colon  DISPOSITION OF SPECIMEN:  PATHOLOGY  COUNTS:  YES  PLAN OF CARE: Admit to inpatient   PATIENT DISPOSITION:  PACU - hemodynamically stable.   INDICATIONS: This is a 50 y.o. male who presented to my office with endoscopically unresectable colon polyp. The risk and benefits and alternative treatments were explained to the patient prior to the OR and the patient has elected to proceed with robotic assisted R colectomy.  Consent was signed and placed on chart prior to the OR.   OR FINDINGS: normal appearing anatomy  DESCRIPTION:   Informed consent was confirmed.  The patient underwent general anaesthesia without difficulty.  The patient was positioned appropriately.  VTE prevention in place.  The patient's abdomen was clipped, prepped, & draped in a sterile fashion.  Surgical timeout confirmed our plan.  The patient was positioned in reverse Trendelenburg.  Abdominal entry was gained using a Varies needle.  Entry was clean.  I induced carbon dioxide insufflation.  An 40mm robotic port was placed in the LUQ.  Camera inspection revealed no injury.  Extra ports were carefully placed under direct laparoscopic visualization.  I laparoscopically reflected the greater omentum and the upper abdomen the small bowel in the upper abdomen. The patient was appropriately positioned and the robot was docked to the patient's right side.  Instruments were placed under direct visualization.    I began by identifying the ileocolic  artery and vein within the mesentery. Dissection was bluntly carried around these structures. The duodenum was identified and free from the structures. I then separated the structures bluntly and used the Enseal device to transect these separately.  I developed the retroperitoneal plane bluntly.  I then freed the appendix off its attachments to the pelvic wall. I mobilized the terminal ileum.  I took care to avoid injuring any retroperitoneal structures.  After this I began to mobilize laterally down the white line of [place] and then took down the hepatic flexure using the Enseal device. I mobilized the omentum off of the right transverse colon. The entire colon was then flipped medially and mobilized off of the retroperitoneal structures until I could visualize the lateral edge of the duodenum underneath.  I gently freed the duodenal attachments.   I then divided the terminal ileal mesentery up to the level of just proximal to the ileocecal valve using the robotic vessel sealer.  I divided the transverse colon mesentery just proximal to the right branch of the middle colic artery and vein up to the level of the transverse colon.  I then used a blue load stapler to transect the terminal ileum.  I used a green load stapler x2 to transect the transverse colon.  This freed the specimen, which I placed in the right upper quadrant.  I then oriented my terminal ileum in a isoperistaltic fashion to the transverse colon.  Enterotomies were made with both using robotic scissors.  A white load robotic 60 mm stapler was used to create an anastomosis between the  ileum and transverse colon.  Hemostasis was good at the staple line.  I then closed the common enterotomy with 2, 2 OV lock sutures in a running canal fashion.  The omentum was then brought down over the abdominal contents and the abdomen was inspected.  There was no sign of active bleeding.  The abdomen was then irrigated with normal saline.  I then enlarged the 12  mm suprapubic port into a Pfannenstiel incision and placed an [place] wound protector.  The colon was removed from the abdomen.  The [place] wound protector was then removed.  The peritoneum was closed using a running 0 Vicryl suture.  The fascia was closed using #1 Novafil interrupted sutures.  The subcutaneous tissue of the extraction incision was closed using a running 2-0 Vicryl suture. The skin was then closed using a running 4-0 Vicryl suture.  The port sites were also closed using interrupted 4-0 Vicryl sutures.  Dermabond was placed on the port sites and a sterile dressing was placed over the abdominal incision. All counts were correct per operating room staff. The patient was then awakened from anesthesia and sent to the post anesthesia care unit in stable condition.

## 2020-11-10 DIAGNOSIS — R519 Headache, unspecified: Secondary | ICD-10-CM | POA: Diagnosis present

## 2020-11-10 DIAGNOSIS — F419 Anxiety disorder, unspecified: Secondary | ICD-10-CM | POA: Diagnosis present

## 2020-11-10 DIAGNOSIS — E782 Mixed hyperlipidemia: Secondary | ICD-10-CM | POA: Insufficient documentation

## 2020-11-10 DIAGNOSIS — F32A Depression, unspecified: Secondary | ICD-10-CM | POA: Diagnosis present

## 2020-11-10 LAB — CBC
HCT: 38.1 % — ABNORMAL LOW (ref 39.0–52.0)
Hemoglobin: 13.1 g/dL (ref 13.0–17.0)
MCH: 30.8 pg (ref 26.0–34.0)
MCHC: 34.4 g/dL (ref 30.0–36.0)
MCV: 89.6 fL (ref 80.0–100.0)
Platelets: 193 10*3/uL (ref 150–400)
RBC: 4.25 MIL/uL (ref 4.22–5.81)
RDW: 13.2 % (ref 11.5–15.5)
WBC: 13.2 10*3/uL — ABNORMAL HIGH (ref 4.0–10.5)
nRBC: 0 % (ref 0.0–0.2)

## 2020-11-10 LAB — BASIC METABOLIC PANEL
Anion gap: 8 (ref 5–15)
BUN: 9 mg/dL (ref 6–20)
CO2: 29 mmol/L (ref 22–32)
Calcium: 8.7 mg/dL — ABNORMAL LOW (ref 8.9–10.3)
Chloride: 102 mmol/L (ref 98–111)
Creatinine, Ser: 1.19 mg/dL (ref 0.61–1.24)
GFR, Estimated: >60 mL/min (ref >60)
Glucose, Bld: 120 mg/dL — ABNORMAL HIGH (ref 70–99)
Potassium: 3.8 mmol/L (ref 3.5–5.1)
Sodium: 139 mmol/L (ref 135–145)

## 2020-11-10 MED ORDER — HYDROCORTISONE (PERIANAL) 2.5 % EX CREA
1.0000 "application " | TOPICAL_CREAM | Freq: Four times a day (QID) | CUTANEOUS | Status: DC | PRN
  Filled 2020-11-10: qty 28.35

## 2020-11-10 MED ORDER — SODIUM CHLORIDE 0.9% FLUSH: 3.0000 mL | INTRAVENOUS | Status: DC | PRN

## 2020-11-10 MED ORDER — MAGIC MOUTHWASH
15.0000 mL | Freq: Four times a day (QID) | ORAL | Status: DC | PRN
  Filled 2020-11-10: qty 15

## 2020-11-10 MED ORDER — MENTHOL 3 MG MT LOZG: 1.0000 | LOZENGE | OROMUCOSAL | Status: DC | PRN

## 2020-11-10 MED ORDER — SIMETHICONE 40 MG/0.6ML PO SUSP
40.0000 mg | Freq: Four times a day (QID) | ORAL | Status: DC | PRN
  Filled 2020-11-10: qty 0.6

## 2020-11-10 MED ORDER — PHENOL 1.4 % MT LIQD
2.0000 | OROMUCOSAL | Status: DC | PRN
  Filled 2020-11-10: qty 177

## 2020-11-10 MED ORDER — SODIUM CHLORIDE 0.9% FLUSH
3.0000 mL | Freq: Two times a day (BID) | INTRAVENOUS | Status: DC
  Administered 2020-11-10 (×2): 3 mL via INTRAVENOUS

## 2020-11-10 MED ORDER — HYDROCORTISONE 1 % EX CREA
1.0000 "application " | TOPICAL_CREAM | Freq: Three times a day (TID) | CUTANEOUS | Status: DC | PRN
  Filled 2020-11-10: qty 28

## 2020-11-10 MED ORDER — LIP MEDEX EX OINT
1.0000 "application " | TOPICAL_OINTMENT | Freq: Two times a day (BID) | CUTANEOUS | Status: DC
  Administered 2020-11-10: 1 via TOPICAL
  Filled 2020-11-10: qty 7

## 2020-11-10 MED ORDER — LACTATED RINGERS IV BOLUS: 1000.0000 mL | Freq: Three times a day (TID) | INTRAVENOUS | Status: DC | PRN

## 2020-11-10 MED ORDER — METOPROLOL TARTRATE 5 MG/5ML IV SOLN
5.0000 mg | Freq: Four times a day (QID) | INTRAVENOUS | Status: DC | PRN
  Filled 2020-11-10: qty 5

## 2020-11-10 MED ORDER — BISMUTH SUBSALICYLATE 262 MG/15ML PO SUSP
30.0000 mL | Freq: Three times a day (TID) | ORAL | Status: DC | PRN
  Filled 2020-11-10: qty 236

## 2020-11-10 MED ORDER — SODIUM CHLORIDE 0.9 % IV SOLN: 250.0000 mL | INTRAVENOUS | Status: DC | PRN

## 2020-11-10 MED ORDER — GUAIFENESIN-DM 100-10 MG/5ML PO SYRP: 10.0000 mL | ORAL_SOLUTION | ORAL | Status: DC | PRN

## 2020-11-10 MED ORDER — CALCIUM POLYCARBOPHIL 625 MG PO TABS
625.0000 mg | ORAL_TABLET | Freq: Two times a day (BID) | ORAL | Status: DC
  Administered 2020-11-10 – 2020-11-11 (×3): 625 mg via ORAL
  Filled 2020-11-10 (×4): qty 1

## 2020-11-10 NOTE — Progress Notes (Signed)
Pharmacy Brief Note - Alvimopan (Entereg)  The standing order set for alvimopan (Entereg) now includes an automatic order to discontinue the drug after the patient has had a bowel movement. The change was approved by the [place] and the Medical Executive Committee.   This patient has had bowel movements documented by nursing. Therefore, alvimopan has been discontinued. If there are questions, please contact the pharmacy at [phone].   Thank you-  Peggyann Juba, PharmD, BCPS 11/10/2020 5:45 PM

## 2020-11-10 NOTE — Progress Notes (Signed)
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  Stop IV fluids.  Foley out.  Follow-up on pathology.  Control depression/anxiety  Controlled hypertension.  -VTE prophylaxis- SCDs, enoxaparin   -mobilize as tolerated to help recovery  Disposition:  Disposition:  The patient is from: Home  Anticipate discharge to:  Home  Anticipated Date of Discharge is:  March 13,2022    Barriers to discharge:  Pending Clinical improvement (more likely than not)  Patient currently is NOT MEDICALLY STABLE for discharge from the hospital from a surgery standpoint.      20 minutes spent in review, evaluation, examination, counseling, and coordination of care.   I have reviewed this patient's available data, including medical history, events of note, physical examination and test results as part of my evaluation.  A significant portion of that time was spent in counseling.  Care during the described time interval was provided by me.  11/10/2020    Subjective: (Chief complaint)  Patient moving bowels.  Tolerating liquids.  Mildly sore but pain controlled  Objective:  Vital signs:  Vitals:   11/09/20 2139 11/10/20 0106 11/10/20 0500  11/10/20 0610  BP: (!) 151/90 (!) 143/90 (!) 139/95   Pulse: 100 81 83   Resp: 17 20 17    Temp: 98.2 F (36.8 C) 99.3 F (37.4 C) 99.1 F (37.3 C)   TempSrc: Oral Oral Oral   SpO2: 91% 94% 96%   Weight:    125.5 kg  Height:           Intake/Output   Yesterday:  03/11 0701 - 03/12 0700 In: 4532.3 [P.O.:840; I.V.:3592.3; IV Piggyback:100] Out: 3350 [Urine:3320; Blood:30] This shift:  Total I/O In: 120 [P.O.:120] Out: 0   Bowel function:  Flatus: YES  BM:  YES  Drain: (No drain)   Physical Exam:  General: Pt awake/alert in no acute distress Eyes: PERRL, normal EOM.  Sclera clear.  No icterus Neuro: CN II-XII intact w/o focal sensory/motor deficits. Lymph: No head/neck/groin lymphadenopathy Psych:  No delerium/psychosis/paranoia.  Oriented x 4 HENT: Normocephalic, Mucus membranes moist.  No thrush Neck: Supple, No tracheal deviation.  No obvious thyromegaly Chest: No pain to chest wall compression.  Good respiratory excursion.  No audible wheezing CV:  Pulses intact.  Regular rhythm.  No major extremity edema MS: Normal AROM mjr joints.  No obvious deformity  Abdomen: Soft.  Mildy distended.  Mildly tender at incisions only.  No evidence of peritonitis.  No incarcerated hernias.  Ext:   No deformity.  No mjr edema.  No cyanosis Skin: No petechiae / purpurea.  No major sores.  Warm and dry    Results:   Cultures: Recent Results (from the past 720 hour(s))  SARS CORONAVIRUS 2 (TAT 6-24 HRS) Nasopharyngeal  Nasopharyngeal Swab     Status: None   Collection Time: 11/06/20  2:17 PM   Specimen: Nasopharyngeal Swab  Result Value Ref Range Status   SARS Coronavirus 2 NEGATIVE NEGATIVE Final    Comment: (NOTE) SARS-CoV-2 target nucleic acids are NOT DETECTED.  The SARS-CoV-2 RNA is generally detectable in upper and lower respiratory specimens during the acute phase of infection. Negative results do not preclude SARS-CoV-2 infection, do not rule out co-infections  with other pathogens, and should not be used as the sole basis for treatment or other patient management decisions. Negative results must be combined with clinical observations, patient history, and epidemiological information. The expected result is Negative.  Fact Sheet for Patients: SugarRoll.be  Fact Sheet for Healthcare Providers: https://www.woods-mathews.com/  This test is not yet approved or cleared by the [place] FDA and  has been authorized for detection and/or diagnosis of SARS-CoV-2 by FDA under an Emergency Use Authorization (EUA). This EUA will remain  in effect (meaning this test can be used) for the duration of the COVID-19 declaration under Se ction 564(b)(1) of the Act, 21 U.S.C. section 360bbb-3(b)(1), unless the authorization is terminated or revoked sooner.  Performed at [facility], [place] [address], [place], [place] [address]     Labs: Results for orders placed or performed during the hospital encounter of 11/09/20 (from the past 48 hour(s))  ABO/Rh     Status: None   Collection Time: 11/09/20  6:22 AM  Result Value Ref Range   ABO/RH(D)      A POS Performed at [facility], [place] [address], [place], [place] [address]   CBC     Status: Abnormal   Collection Time: 11/10/20  5:20 AM  Result Value Ref Range   WBC 13.2 (H) 4.0 - 10.5 K/uL   RBC 4.25 4.22 - 5.81 MIL/uL   Hemoglobin 13.1 13.0 - 17.0 g/dL   HCT 38.1 (L) 39.0 - 52.0 %   MCV 89.6 80.0 - 100.0 fL   MCH 30.8 26.0 - 34.0 pg   MCHC 34.4 30.0 - 36.0 g/dL   RDW 13.2 11.5 - 15.5 %   Platelets 193 150 - 400 K/uL   nRBC 0.0 0.0 - 0.2 %    Comment: Performed at [facility], [place] [address], [place], [place] [address]  Basic metabolic panel     Status: Abnormal   Collection Time: 11/10/20  5:20 AM  Result Value Ref Range   Sodium 139 135 - 145 mmol/L   Potassium 3.8 3.5 - 5.1 mmol/L   Chloride 102 98 - 111  mmol/L   CO2 29 22 - 32 mmol/L   Glucose, Bld 120 (H) 70 - 99 mg/dL    Comment: Glucose reference range applies only to samples taken after fasting for at least 8 hours.   BUN 9 6 - 20 mg/dL   Creatinine, Ser 1.19 0.61 - 1.24 mg/dL   Calcium 8.7 (L) 8.9 - 10.3 mg/dL   GFR, Estimated >60 >60 mL/min    Comment: (NOTE) Calculated using the CKD-EPI Creatinine Equation (2021)    Anion gap 8 5 - 15    Comment: Performed at [facility], [place] [address], [place], [place] [address]    Imaging / Studies: No results found.  Medications / Allergies: per chart  Antibiotics: Anti-infectives (From admission, onward)   Start     Dose/Rate Route Frequency Ordered Stop   11/09/20 2000  cefoTEtan (CEFOTAN) 2 g  in sodium chloride 0.9 % 100 mL IVPB        2 g 200 mL/hr over 30 Minutes Intravenous Every 12 hours 11/09/20 1233 11/09/20 2031   11/09/20 0600  cefoTEtan (CEFOTAN) 2 g in sodium chloride 0.9 % 100 mL IVPB        2 g 200 mL/hr over 30 Minutes Intravenous On call to O.R. 11/09/20 9122 11/09/20 0752        Note: Portions of this report may have been transcribed using voice recognition software. Every effort was made to ensure accuracy; however, inadvertent computerized transcription errors may be present.   Any transcriptional errors that result from this process are unintentional.    Adin Hector, MD, FACS, MASCRS  Gastrointestinal and Minimally Invasive Surgery  [facility] [address] [place], [place] [address] [phone] Fax [phone] Main/Paging  CONTACT INFORMATION: Weekday (9AM-5PM) concerns: Call [facility] main office at [phone] Weeknight (5PM-9AM) or Weekend/Holiday concerns: Check [email or web address] for General Surgery [facility] coverage (Please, do not use SecureChat as it is not reliable communication to operating surgeons for immediate patient care)      11/10/2020  12:17 PM

## 2020-11-11 LAB — BASIC METABOLIC PANEL
Anion gap: 10 (ref 5–15)
BUN: 10 mg/dL (ref 6–20)
CO2: 26 mmol/L (ref 22–32)
Calcium: 9.2 mg/dL (ref 8.9–10.3)
Chloride: 103 mmol/L (ref 98–111)
Creatinine, Ser: 1 mg/dL (ref 0.61–1.24)
GFR, Estimated: >60 mL/min (ref >60)
Glucose, Bld: 92 mg/dL (ref 70–99)
Potassium: 3.4 mmol/L — ABNORMAL LOW (ref 3.5–5.1)
Sodium: 139 mmol/L (ref 135–145)

## 2020-11-11 LAB — CBC
HCT: 38.6 % — ABNORMAL LOW (ref 39.0–52.0)
Hemoglobin: 13.2 g/dL (ref 13.0–17.0)
MCH: 30.7 pg (ref 26.0–34.0)
MCHC: 34.2 g/dL (ref 30.0–36.0)
MCV: 89.8 fL (ref 80.0–100.0)
Platelets: 196 10*3/uL (ref 150–400)
RBC: 4.3 MIL/uL (ref 4.22–5.81)
RDW: 13.2 % (ref 11.5–15.5)
WBC: 12.2 10*3/uL — ABNORMAL HIGH (ref 4.0–10.5)
nRBC: 0 % (ref 0.0–0.2)

## 2020-11-11 MED ORDER — TRAMADOL HCL 50 MG PO TABS: 50.0000 mg | ORAL_TABLET | Freq: Four times a day (QID) | ORAL | 0 refills | Status: AC | PRN

## 2020-11-11 NOTE — Discharge Summary (Signed)
Physician Discharge Summary    Patient ID: Corey Gregory MRN: [id number] DOB/AGE: 1970-10-31  50 y.o.  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  Postoperatively, the patient gradually mobilized and advanced to a solid diet.  Pain and other symptoms were treated aggressively.    By the time of discharge, the patient was walking well the hallways, eating food, having flatus.  Pain was well-controlled on an oral medications.  Based on meeting discharge criteria and continuing to recover, I felt it was safe for the patient to be discharged from the hospital to further recover with close followup. Postoperative recommendations were discussed in detail.  They are written as well.  Discharged Condition: good  Discharge Exam: Blood pressure (!) 141/92, pulse 82, temperature 99.1 F (37.3 C), temperature source Oral, resp. rate 18, height 6\' 3"  (1.905 m), weight 125.5 kg, SpO2 96 %.  General: Pt awake/alert/oriented x4 in No acute distress.  Smiling, relaxed Eyes: PERRL, normal EOM.  Sclera clear.  No icterus Neuro: CN II-XII intact w/o focal sensory/motor deficits. Lymph: No head/neck/groin lymphadenopathy Psych:  No delerium/psychosis/paranoia HENT: Normocephalic, Mucus membranes moist.  No thrush Neck: Supple, No tracheal deviation Chest: No chest wall pain w good excursion CV:  Pulses intact.   Regular rhythm MS: Normal AROM mjr joints.  No obvious deformity Abdomen: Soft.  Nondistended.  Nontender.  Incisions c/d/i.  No evidence of peritonitis.  No incarcerated hernias. Ext:  SCDs BLE.  No mjr edema.  No cyanosis Skin: No petechiae / purpura   Disposition:    Follow-up Information    Leighton Ruff, MD. Schedule an appointment as soon as possible for a visit in 2 weeks.   Specialties: General Surgery, Colon and Rectal Surgery Contact information: [address] [place] [address] [phone]               Discharge disposition: 01-Home or Self Care       Discharge Instructions    Call MD for:   Complete by: As directed    FEVER > 101.5 F  (temperatures < 101.5 F are not significant)   Call MD for:  extreme fatigue   Complete by: As directed    Call MD for:  persistant dizziness or light-headedness   Complete by: As directed    Call MD for:  persistant nausea and vomiting   Complete by: As directed    Call MD for:  redness, tenderness, or signs of infection (pain, swelling, redness, odor or green/yellow discharge around incision site)   Complete by: As directed    Call MD for:  severe uncontrolled pain   Complete by: As directed    Diet - low sodium heart healthy   Complete by: As directed    Start with a bland diet such as soups, liquids, starchy foods, low fat foods, etc. the first few days at home.  Gradually advance to a solid, low-fat, high fiber diet by the end of the first week at home.   Add a fiber supplement to your diet (Metamucil, etc) If you feel full, bloated, or constipated, stay on a full liquid or pureed/blenderized diet for a few days until you feel better and are no longer constipated.   Discharge instructions   Complete by: As directed    See Discharge Instructions If you are not getting better after two weeks or are noticing you are getting worse, contact our office [phone] for further advice.  We may need to  adjust your medications, re-evaluate you in the office, send you to the emergency room, or see what other things we can do to help. The clinic staff is available to answer your questions during regular business hours (8:30am-5pm).  Please don't hesitate to call and ask to speak to one of our nurses for clinical concerns.    A surgeon from [facility] is always on call at the hospitals 24 hours/day If you have a medical emergency, go to the nearest emergency room or call 911.   Discharge wound care:   Complete by: As directed    It is good for closed incisions and even open wounds to be washed every day.  Shower every day.  Short baths are fine.  Wash the incisions and wounds clean with soap & water.    You may leave closed incisions open to air if it is dry.   You may cover the incision with clean gauze & replace it after your daily shower for comfort.  DERMABOND:  You have purple skin glue (Dermabond) on your incision(s).  Leave them in place, and they will fall off on their own like a scab in 2-3 weeks.  You may trim any edges that curl up with clean scissors.   Driving Restrictions   Complete by: As directed    You may drive when: - you are no longer taking narcotic prescription pain medication - you can comfortably wear a seatbelt - you can safely make sudden turns/stops without pain.   Increase activity slowly   Complete by: As directed    Start light daily activities --- self-care, walking, climbing stairs- beginning the day after surgery.  Gradually increase activities as tolerated.  Control your pain to be active.  Stop when you are tired.  Ideally, walk several times a day, eventually an hour a day.   Most people are back to most day-to-day activities in a few weeks.  It takes 4-6 weeks to get back to unrestricted, intense activity. If you can walk 30 minutes without difficulty, it is safe to try more intense activity such as jogging, treadmill, bicycling, low-impact  aerobics, swimming, etc. Save the most intensive and strenuous activity for last (Usually 4-8 weeks after surgery) such as sit-ups, heavy lifting, contact sports, etc.  Refrain from any intense heavy lifting or straining until you are off narcotics for pain control.  You will have off days, but things should improve week-by-week. DO NOT PUSH THROUGH PAIN.  Let pain be your guide: If it hurts to do something, don't do it.   Lifting restrictions   Complete by: As directed    If you can walk 30 minutes without difficulty, it is safe to try more intense activity such as jogging, treadmill, bicycling, low-impact aerobics, swimming, etc. Save the most intensive and strenuous activity for last (Usually 4-8 weeks after surgery) such as sit-ups, heavy lifting, contact  sports, etc.   Refrain from any intense heavy lifting or straining until you are off narcotics for pain control.  You will have off days, but things should improve week-by-week. DO NOT PUSH THROUGH PAIN.  Let pain be your guide: If it hurts to do something, don't do it.  Pain is your body warning you to avoid that activity for another week until the pain goes down.   May shower / Bathe   Complete by: As directed    May walk up steps   Complete by: As directed    Remove dressing in 72 hours   Complete by: As directed    Make sure all dressings are removed by the third day after surgery.  Leave incisions open to air.  OK to cover incisions with gauze or bandages as desired   Sexual Activity Restrictions   Complete by: As directed    You may have sexual intercourse when it is comfortable. If it hurts to do something, stop.      Allergies as of 11/11/2020      Reactions   Simvastatin Nausea And Vomiting      Medication List    TAKE these medications   COLLAGEN PO Take 1 tablet by mouth daily.   lisinopril-hydrochlorothiazide 20-12.5 MG tablet Commonly known as: ZESTORETIC Take 1 tablet by mouth every evening.   magnesium oxide  400 MG tablet Commonly known as: MAG-OX Take 400 mg by mouth daily.   mirtazapine 15 MG tablet Commonly known as: REMERON Take 15 mg by mouth at bedtime.   PARoxetine 20 MG tablet Commonly known as: PAXIL Take 20 mg by mouth every evening.   pravastatin 10 MG tablet Commonly known as: PRAVACHOL Take 10 mg by mouth at bedtime.   traMADol 50 MG tablet Commonly known as: ULTRAM Take 1-2 tablets (50-100 mg total) by mouth every 6 (six) hours as needed for moderate pain or severe pain.            Discharge Care Instructions  (From admission, onward)         Start     Ordered   11/11/20 0000  Discharge wound care:       Comments: It is good for closed incisions and even open wounds to be washed every day.  Shower every day.  Short baths are fine.  Wash the incisions and wounds clean with soap & water.    You may leave closed incisions open to air if it is dry.   You may cover the incision with clean gauze & replace it after your daily shower for comfort.  DERMABOND:  You have purple skin glue (Dermabond) on your incision(s).  Leave them in place, and they will fall off on their own like a scab in 2-3 weeks.  You may trim any edges that curl up with clean scissors.   11/11/20 0756          Significant Diagnostic Studies:  Results for orders placed or performed during the hospital encounter of 11/09/20 (from the past 72 hour(s))  ABO/Rh     Status: None   Collection Time: 11/09/20  6:22 AM  Result Value Ref Range   ABO/RH(D)      A POS Performed at [facility], [place] [address], [place], [place] [address]   CBC     Status: Abnormal   Collection Time: 11/10/20  5:20 AM  Result Value Ref Range   WBC 13.2 (H) 4.0 - 10.5 K/uL  RBC 4.25 4.22 - 5.81 MIL/uL   Hemoglobin 13.1 13.0 - 17.0 g/dL   HCT 38.1 (L) 39.0 - 52.0 %   MCV 89.6 80.0 - 100.0 fL   MCH 30.8 26.0 - 34.0 pg   MCHC 34.4 30.0 - 36.0 g/dL   RDW 13.2 11.5 - 15.5 %   Platelets 193 150 - 400  K/uL   nRBC 0.0 0.0 - 0.2 %    Comment: Performed at [facility], [place] [address], [place], [place] [address]  Basic metabolic panel     Status: Abnormal   Collection Time: 11/10/20  5:20 AM  Result Value Ref Range   Sodium 139 135 - 145 mmol/L   Potassium 3.8 3.5 - 5.1 mmol/L   Chloride 102 98 - 111 mmol/L   CO2 29 22 - 32 mmol/L   Glucose, Bld 120 (H) 70 - 99 mg/dL    Comment: Glucose reference range applies only to samples taken after fasting for at least 8 hours.   BUN 9 6 - 20 mg/dL   Creatinine, Ser 1.19 0.61 - 1.24 mg/dL   Calcium 8.7 (L) 8.9 - 10.3 mg/dL   GFR, Estimated >60 >60 mL/min    Comment: (NOTE) Calculated using the CKD-EPI Creatinine Equation (2021)    Anion gap 8 5 - 15    Comment: Performed at [facility], [place] [address], [place], [place] [address]  CBC     Status: Abnormal   Collection Time: 11/11/20  5:50 AM  Result Value Ref Range   WBC 12.2 (H) 4.0 - 10.5 K/uL   RBC 4.30 4.22 - 5.81 MIL/uL   Hemoglobin 13.2 13.0 - 17.0 g/dL   HCT 38.6 (L) 39.0 - 52.0 %   MCV 89.8 80.0 - 100.0 fL   MCH 30.7 26.0 - 34.0 pg   MCHC 34.2 30.0 - 36.0 g/dL   RDW 13.2 11.5 - 15.5 %   Platelets 196 150 - 400 K/uL   nRBC 0.0 0.0 - 0.2 %    Comment: Performed at [facility], [place] [address], [place], [place] [address]  Basic metabolic panel     Status: Abnormal   Collection Time: 11/11/20  5:50 AM  Result Value Ref Range   Sodium 139 135 - 145 mmol/L   Potassium 3.4 (L) 3.5 - 5.1 mmol/L   Chloride 103 98 - 111 mmol/L   CO2 26 22 - 32 mmol/L   Glucose, Bld 92 70 - 99 mg/dL    Comment: Glucose reference range applies only to samples taken after fasting for at least 8 hours.   BUN 10 6 - 20 mg/dL   Creatinine, Ser 1.00 0.61 - 1.24 mg/dL   Calcium 9.2 8.9 - 10.3 mg/dL   GFR, Estimated >60 >60 mL/min    Comment: (NOTE) Calculated using the CKD-EPI Creatinine Equation (2021)    Anion gap 10 5 - 15    Comment:  Performed at [facility], [place] [address] [place], [place] [address]    No results found.  Past Medical History:  Diagnosis Date  . Anxiety   . Chronic kidney disease 2017  . Depression   . Headache    history of  . History of kidney stones   . Hypertension 2007    Past Surgical History:  Procedure Laterality Date  . CYSTOSCOPY WITH RETROGRADE PYELOGRAM, URETEROSCOPY AND STENT PLACEMENT Right 03/14/2016   Procedure: CYSTOSCOPY WITH RIGHT RETROGRADE PYELOGRAM, URETEROSCOPY, LASER, BASKET EXTRACTION  AND STENT PLACEMENT;  Surgeon: Ardis Hughs, MD;  Location: [facility];  Service: Urology;  Laterality: Right;  . GANGLION CYST EXCISION Right 2005  . HOLMIUM LASER APPLICATION Right 01/17/8415   Procedure: HOLMIUM LASER APPLICATION;  Surgeon: Ardis Hughs, MD;  Location: [facility];  Service: Urology;  Laterality: Right;  . LITHOTRIPSY    . [employer] EXTRACTION  1998    Social History   Socioeconomic History  . Marital status: Married    Spouse name: Not on file  . Number of children: Not on file  . Years of education: Not on file  . Highest education level: Not on file  Occupational History  . Not on file  Tobacco Use  . Smoking status: Never Smoker  . Smokeless tobacco: Never Used  Vaping Use  . Vaping Use: Never used  Substance and Sexual Activity  . Alcohol use: No  . Drug use: Yes    Types: Marijuana  . Sexual activity: Not on file  Other Topics Concern  . Not on file  Social History Narrative  . Not on file   Social Determinants of Health   Financial Resource Strain: Not on file  Food Insecurity: Not on file  Transportation Needs: Not on file  Physical Activity: Not on file  Stress: Not on file  Social Connections: Not on file  Intimate Partner Violence: Not on file    Family History  Problem Relation Age of Onset  . Kidney Stones Father   . Prostate cancer Father     Current Facility-Administered Medications  Medication  Dose Route Frequency Provider Last Rate Last Admin  . 0.9 %  sodium chloride infusion  250 mL Intravenous PRN Michael Boston, MD      . acetaminophen (TYLENOL) tablet 1,000 mg  1,000 mg Oral S0Y Leighton Ruff, MD   3,016 mg at 11/11/20 0106  . alum & mag hydroxide-simeth (MAALOX/MYLANTA) 200-200-20 MG/5ML suspension 30 mL  30 mL Oral W1U PRN Leighton Ruff, MD      . bismuth subsalicylate (PEPTO BISMOL) 262 MG/15ML suspension 30 mL  30 mL Oral Q8H PRN Michael Boston, MD      . diphenhydrAMINE (BENADRYL) capsule 25 mg  25 mg Oral X3A PRN Leighton Ruff, MD       Or  . diphenhydrAMINE (BENADRYL) injection 25 mg  25 mg Intravenous T5T PRN Leighton Ruff, MD      . enoxaparin (LOVENOX) injection 40 mg  40 mg Subcutaneous D32K Leighton Ruff, MD   40 mg at 11/11/20 0751  . feeding supplement (ENSURE SURGERY) liquid 237 mL  237 mL Oral BID BM Leighton Ruff, MD   025 mL at 11/10/20 1443  . gabapentin (NEURONTIN) capsule 300 mg  300 mg Oral BID Leighton Ruff, MD   427 mg at 11/10/20 2217  . guaiFENesin-dextromethorphan (ROBITUSSIN DM) 100-10 MG/5ML syrup 10 mL  10 mL Oral Q4H PRN Michael Boston, MD      . lisinopril (ZESTRIL) tablet 20 mg  20 mg Oral QPM Leighton Ruff, MD   20 mg at 11/10/20 1736   And  . hydrochlorothiazide (MICROZIDE) capsule 12.5 mg  12.5 mg Oral QPM Leighton Ruff, MD   06.2 mg at 11/10/20 1740  . hydrocortisone (ANUSOL-HC) 2.5 % rectal cream 1 application  1 application Topical QID PRN Michael Boston, MD      . hydrocortisone cream 1 % 1 application  1 application Topical TID PRN Michael Boston, MD      . HYDROmorphone (  DILAUDID) injection 0.5 mg  0.5 mg Intravenous X4J PRN Leighton Ruff, MD   0.5 mg at 11/09/20 2040  . lactated ringers bolus 1,000 mL  1,000 mL Intravenous Q8H PRN Michael Boston, MD      . lip balm (CARMEX) ointment 1 application  1 application Topical BID Michael Boston, MD   1 application at 28/78/67 1444  . magic mouthwash  15 mL Oral QID PRN Michael Boston, MD       . menthol-cetylpyridinium (CEPACOL) lozenge 3 mg  1 lozenge Oral PRN Michael Boston, MD      . metoprolol tartrate (LOPRESSOR) injection 5 mg  5 mg Intravenous Q6H PRN Michael Boston, MD      . mirtazapine (REMERON) tablet 15 mg  15 mg Oral QHS Leighton Ruff, MD   15 mg at 11/10/20 2217  . ondansetron (ZOFRAN) tablet 4 mg  4 mg Oral E7M PRN Leighton Ruff, MD       Or  . ondansetron [facility]) injection 4 mg  4 mg Intravenous C9O PRN Leighton Ruff, MD      . PARoxetine (PAXIL) tablet 20 mg  20 mg Oral QPM Leighton Ruff, MD   20 mg at 11/10/20 1736  . phenol (CHLORASEPTIC) mouth spray 2 spray  2 spray Mouth/Throat PRN Michael Boston, MD      . polycarbophil (FIBERCON) tablet 625 mg  625 mg Oral BID Michael Boston, MD   625 mg at 11/10/20 2217  . simethicone (MYLICON) 40 BS/9.6GE suspension 40 mg  40 mg Oral QID PRN Michael Boston, MD      . sodium chloride flush (NS) 0.9 % injection 3 mL  3 mL Intravenous Gorden Harms, MD   3 mL at 11/10/20 2218  . sodium chloride flush (NS) 0.9 % injection 3 mL  3 mL Intravenous PRN Michael Boston, MD         Allergies  Allergen Reactions  . Simvastatin Nausea And Vomiting    Signed: Morton Peters, MD, FACS, MASCRS  Gastrointestinal and Minimally Invasive Surgery  [facility] [address] [place], [place] [address] [phone] Fax [phone] Main/Paging  CONTACT INFORMATION: Weekday (9AM-5PM) concerns: Call [facility] main office at [phone] Weeknight (5PM-9AM) or Weekend/Holiday concerns: Check [email or web address] for General Surgery [facility] coverage (Please, do not use SecureChat as it is not reliable communication to operating surgeons for immediate patient care)      11/11/2020, 7:56 AM

## 2020-11-11 NOTE — Progress Notes (Signed)
Pt alert and oriented, tolerating diet. D/C instructions given, pt d/cd to home. 

## 2020-11-13 LAB — SURGICAL PATHOLOGY

## 2020-11-21 ENCOUNTER — Other Ambulatory Visit: Payer: Self-pay

## 2020-11-21 NOTE — Progress Notes (Signed)
The proposed treatment discussed in conference is for discussion purposes only and is not a binding recommendation.  The patients have not been physically examined, or presented with their treatment options.  Therefore, final treatment plans cannot be decided.   

## 2022-07-25 ENCOUNTER — Emergency Department (HOSPITAL_COMMUNITY)
Admission: EM | Admit: 2022-07-25 | Discharge: 2022-07-25 | Disposition: A | Discharge status: Home / Self Care | Payer: Commercial Managed Care - HMO | Attending: Student | Admitting: Student

## 2022-07-25 ENCOUNTER — Emergency Department (HOSPITAL_COMMUNITY): Payer: Commercial Managed Care - HMO

## 2022-07-25 ENCOUNTER — Encounter (HOSPITAL_COMMUNITY): Payer: Self-pay

## 2022-07-25 ENCOUNTER — Other Ambulatory Visit: Payer: Self-pay

## 2022-07-25 DIAGNOSIS — N189 Chronic kidney disease, unspecified: Secondary | ICD-10-CM | POA: Insufficient documentation

## 2022-07-25 DIAGNOSIS — R112 Nausea with vomiting, unspecified: Secondary | ICD-10-CM | POA: Insufficient documentation

## 2022-07-25 DIAGNOSIS — R55 Syncope and collapse: Secondary | ICD-10-CM | POA: Diagnosis not present

## 2022-07-25 DIAGNOSIS — I129 Hypertensive chronic kidney disease with stage 1 through stage 4 chronic kidney disease, or unspecified chronic kidney disease: Secondary | ICD-10-CM | POA: Diagnosis not present

## 2022-07-25 DIAGNOSIS — Z79899 Other long term (current) drug therapy: Secondary | ICD-10-CM | POA: Diagnosis not present

## 2022-07-25 LAB — COMPREHENSIVE METABOLIC PANEL
ALT: 15 U/L (ref 0–44)
AST: 27 U/L (ref 15–41)
Albumin: 3.9 g/dL (ref 3.5–5.0)
Alkaline Phosphatase: 89 U/L (ref 38–126)
Anion gap: 10 (ref 5–15)
BUN: 12 mg/dL (ref 6–20)
CO2: 24 mmol/L (ref 22–32)
Calcium: 9 mg/dL (ref 8.9–10.3)
Chloride: 104 mmol/L (ref 98–111)
Creatinine, Ser: 1.11 mg/dL (ref 0.61–1.24)
GFR, Estimated: >60 mL/min (ref >60)
Glucose, Bld: 167 mg/dL — ABNORMAL HIGH (ref 70–99)
Potassium: 3.7 mmol/L (ref 3.5–5.1)
Sodium: 138 mmol/L (ref 135–145)
Total Bilirubin: 0.7 mg/dL (ref 0.3–1.2)
Total Protein: 7.4 g/dL (ref 6.5–8.1)

## 2022-07-25 LAB — URINALYSIS, ROUTINE W REFLEX MICROSCOPIC
Bacteria, UA: NONE SEEN
Bilirubin Urine: NEGATIVE
Glucose, UA: NEGATIVE mg/dL
Hgb urine dipstick: NEGATIVE
Ketones, ur: 5 mg/dL — AB
Leukocytes,Ua: NEGATIVE
Nitrite: NEGATIVE
Protein, ur: 100 mg/dL — AB
Specific Gravity, Urine: >1.046 — ABNORMAL HIGH (ref 1.005–1.030)
pH: 9 — ABNORMAL HIGH (ref 5.0–8.0)

## 2022-07-25 LAB — CBC
HCT: 40.1 % (ref 39.0–52.0)
Hemoglobin: 14 g/dL (ref 13.0–17.0)
MCH: 30.5 pg (ref 26.0–34.0)
MCHC: 34.9 g/dL (ref 30.0–36.0)
MCV: 87.4 fL (ref 80.0–100.0)
Platelets: 240 10*3/uL (ref 150–400)
RBC: 4.59 MIL/uL (ref 4.22–5.81)
RDW: 12.3 % (ref 11.5–15.5)
WBC: 9.1 10*3/uL (ref 4.0–10.5)
nRBC: 0 % (ref 0.0–0.2)

## 2022-07-25 LAB — LIPASE, BLOOD: Lipase: 46 U/L (ref 11–51)

## 2022-07-25 LAB — CBG MONITORING, ED: Glucose-Capillary: 161 mg/dL — ABNORMAL HIGH (ref 70–99)

## 2022-07-25 MED ORDER — LORAZEPAM 2 MG/ML IJ SOLN
0.5000 mg | Freq: Once | INTRAMUSCULAR | Status: AC
  Administered 2022-07-25: 0.5 mg via INTRAVENOUS
  Filled 2022-07-25: qty 1

## 2022-07-25 MED ORDER — KETOROLAC TROMETHAMINE 15 MG/ML IJ SOLN
15.0000 mg | Freq: Once | INTRAMUSCULAR | Status: AC
  Administered 2022-07-25: 15 mg via INTRAVENOUS
  Filled 2022-07-25: qty 1

## 2022-07-25 MED ORDER — ONDANSETRON 4 MG PO TBDP: 4.0000 mg | ORAL_TABLET | Freq: Three times a day (TID) | ORAL | 0 refills | Status: AC | PRN

## 2022-07-25 MED ORDER — ONDANSETRON HCL 4 MG/2ML IJ SOLN
4.0000 mg | Freq: Once | INTRAMUSCULAR | Status: AC
  Administered 2022-07-25: 4 mg via INTRAVENOUS
  Filled 2022-07-25: qty 2

## 2022-07-25 MED ORDER — DROPERIDOL 2.5 MG/ML IJ SOLN
1.2500 mg | Freq: Once | INTRAMUSCULAR | Status: AC
  Administered 2022-07-25: 1.25 mg via INTRAVENOUS
  Filled 2022-07-25: qty 2

## 2022-07-25 MED ORDER — IOHEXOL 300 MG/ML  SOLN
100.0000 mL | Freq: Once | INTRAMUSCULAR | Status: AC | PRN
  Administered 2022-07-25: 100 mL via INTRAVENOUS

## 2022-07-25 MED ORDER — LACTATED RINGERS IV BOLUS
1000.0000 mL | Freq: Once | INTRAVENOUS | Status: AC
  Administered 2022-07-25: 1000 mL via INTRAVENOUS

## 2022-07-25 MED ORDER — ONDANSETRON 4 MG PO TBDP
4.0000 mg | ORAL_TABLET | Freq: Once | ORAL | Status: AC | PRN
  Administered 2022-07-25: 4 mg via ORAL
  Filled 2022-07-25: qty 1

## 2022-07-25 NOTE — ED Provider Notes (Signed)
[facility] DEPT Provider Note  CSN: [id number] Arrival date & time: 07/25/22 1655  Chief Complaint(s) Emesis  HPI Corey Gregory is a 51 y.o. male with PMH anxiety, CKD, nephrolithiasis, cecal mass status post colectomy who presents emergency department for evaluation of nausea and vomiting.  Patient states symptoms began abruptly at 9 AM with no associated abdominal pain, chest pain, shortness of breath, headache or other systemic symptoms.  While in the lobby, patient had a syncopal event and at the time of my evaluation, he has returned to normal mental status baseline but feels significantly uncomfortable.   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 Authorizing Provider  ondansetron (ZOFRAN-ODT) 4 MG disintegrating tablet Take 1 tablet (4 mg total) by mouth every 8 (eight) hours as needed for nausea or vomiting. 07/25/22  Yes Azalee Weimer, MD  COLLAGEN PO Take 1 tablet by mouth daily.    [provider]  lisinopril-hydrochlorothiazide (PRINZIDE,ZESTORETIC) 20-12.5 MG tablet Take 1 tablet by mouth every evening.     [provider]  magnesium oxide (MAG-OX) 400 MG tablet Take 400 mg by mouth daily.    [provider]  mirtazapine (REMERON) 15 MG tablet Take 15 mg by mouth at bedtime. 08/29/20   [provider]   PARoxetine (PAXIL) 20 MG tablet Take 20 mg by mouth every evening.  10/08/15   [provider]  pravastatin (PRAVACHOL) 10 MG tablet Take 10 mg by mouth at bedtime. 08/19/20   [provider]  traMADol (ULTRAM) 50 MG tablet Take 1-2 tablets (50-100 mg total) by mouth every 6 (six) hours as needed for moderate pain or severe pain. 11/11/20   Michael Boston, MD                                                                                                                                    Past Surgical History Past Surgical History:  Procedure Laterality Date   CYSTOSCOPY WITH RETROGRADE PYELOGRAM, URETEROSCOPY AND STENT PLACEMENT Right 03/14/2016   Procedure: CYSTOSCOPY WITH RIGHT RETROGRADE PYELOGRAM, URETEROSCOPY, LASER, BASKET EXTRACTION  AND STENT PLACEMENT;  Surgeon: Ardis Hughs, MD;  Location: [facility];  Service: Urology;  Laterality: Right;   GANGLION CYST EXCISION  Right 2005   HOLMIUM LASER APPLICATION Right 1/75/1025   Procedure: HOLMIUM LASER APPLICATION;  Surgeon: Ardis Hughs, MD;  Location: [facility];  Service: Urology;  Laterality: Right;   LITHOTRIPSY     WISDOM TOOTH EXTRACTION  1998   Family History Family History  Problem Relation Age of Onset   Kidney Stones Father    Prostate cancer Father     Social History Social History   Tobacco Use   Smoking status: Never   Smokeless tobacco: Never  Vaping Use   Vaping Use: Never used  Substance Use Topics   Alcohol use: No   Drug use: Yes    Types: Marijuana   Allergies Simvastatin  Review of Systems Review of Systems  Gastrointestinal:  Positive for nausea and vomiting.   Physical Exam Vital Signs  I have reviewed the triage vital signs BP (!) 145/104   Pulse 95   Temp 98.3 F (36.8 C) (Oral)   Resp 16   Ht '6\' 2"'$  (1.88 m)   Wt 106.6 kg   SpO2 96%   BMI 30.17 kg/m   Physical Exam Constitutional:      General: He is not in acute distress.    Appearance: Normal appearance. He is  ill-appearing.  HENT:     Head: Normocephalic and atraumatic.     Nose: No congestion or rhinorrhea.  Eyes:     General:        Right eye: No discharge.        Left eye: No discharge.     Extraocular Movements: Extraocular movements intact.     Pupils: Pupils are equal, round, and reactive to light.  Cardiovascular:     Rate and Rhythm: Normal rate and regular rhythm.     Heart sounds: No murmur heard. Pulmonary:     Effort: No respiratory distress.     Breath sounds: No wheezing or rales.  Abdominal:     General: There is no distension.     Tenderness: There is no abdominal tenderness.  Musculoskeletal:        General: Normal range of motion.     Cervical back: Normal range of motion.  Skin:    General: Skin is warm and dry.  Neurological:     General: No focal deficit present.     Mental Status: He is alert.     ED Results and Treatments Labs (all labs ordered are listed, but only abnormal results are displayed) Labs Reviewed  COMPREHENSIVE METABOLIC PANEL - Abnormal; Notable for the following components:      Result Value   Glucose, Bld 167 (*)    All other components within normal limits  URINALYSIS, ROUTINE W REFLEX MICROSCOPIC - Abnormal; Notable for the following components:   Specific Gravity, Urine >1.046 (*)    pH 9.0 (*)    Ketones, ur 5 (*)    Protein, ur 100 (*)    All other components within normal limits  CBG MONITORING, ED - Abnormal; Notable for the following components:   Glucose-Capillary 161 (*)    All other components within normal limits  LIPASE, BLOOD  CBC  Radiology CT ABDOMEN PELVIS W CONTRAST  Result Date: 07/25/2022 CLINICAL DATA:  Epigastric pain EXAM: CT ABDOMEN AND PELVIS WITH CONTRAST TECHNIQUE: Multidetector CT imaging of the abdomen and pelvis was performed using the standard protocol following bolus  administration of intravenous contrast. RADIATION DOSE REDUCTION: This exam was performed according to the departmental dose-optimization program which includes automated exposure control, adjustment of the mA and/or kV according to patient size and/or use of iterative reconstruction technique. CONTRAST:  192m OMNIPAQUE IOHEXOL 300 MG/ML  SOLN COMPARISON:  CT abdomen and pelvis dated visualized twenty-first 2017 FINDINGS: Lower chest: No acute abnormality. Hepatobiliary: No focal liver abnormality is seen. No gallstones, gallbladder wall thickening, or biliary dilatation. Pancreas: Unremarkable. No pancreatic ductal dilatation or surrounding inflammatory changes. Spleen: Normal in size without focal abnormality. Adrenals/Urinary Tract: Bilateral adrenal glands are unremarkable. Stable large left parapelvic cysts. Additional bilateral small low-attenuation renal lesions which are too small to completely characterize but likely simple cysts. No hydronephrosis. Bilateral nonobstructing renal stones. Bladder is unremarkable Stomach/Bowel: Stomach is within normal limits. Prior right hemicolectomy. No evidence of bowel wall thickening, distention, or inflammatory changes. Vascular/Lymphatic: No significant vascular findings are present. No enlarged abdominal or pelvic lymph nodes. Reproductive: Prostate is unremarkable. Other: No abdominal wall hernia or abnormality. No abdominopelvic ascites. Musculoskeletal: No acute or significant osseous findings. IMPRESSION: 1. No acute findings in the abdomen or pelvis. 2. Bilateral nonobstructing renal stones. 3. Stable large left parapelvic cysts. Electronically Signed   By: LYetta GlassmanM.D.   On: 07/25/2022 19:47    Pertinent labs & imaging results that were available during my care of the patient were reviewed by me and considered in my medical decision making (see MDM for details).  Medications Ordered in ED Medications  ondansetron (ZOFRAN-ODT) disintegrating  tablet 4 mg (4 mg Oral Given 07/25/22 1709)  lactated ringers bolus 1,000 mL (0 mLs Intravenous Stopped 07/25/22 2214)  ondansetron (ZOFRAN) injection 4 mg (4 mg Intravenous Given 07/25/22 1806)  ketorolac (TORADOL) 15 MG/ML injection 15 mg (15 mg Intravenous Given 07/25/22 1830)  droperidol (INAPSINE) 2.5 MG/ML injection 1.25 mg (1.25 mg Intravenous Given 07/25/22 1829)  LORazepam (ATIVAN) injection 0.5 mg (0.5 mg Intravenous Given 07/25/22 1829)  iohexol (OMNIPAQUE) 300 MG/ML solution 100 mL (100 mLs Intravenous Contrast Given 07/25/22 1908)                                                                                                                                     Procedures Procedures  (including critical care time)  Medical Decision Making / ED Course   This patient presents to the ED for concern of nausea, vomiting, this involves an extensive number of treatment options, and is a complaint that carries with it a high risk of complications and morbidity.  The differential diagnosis includes gastroenteritis, pancreatitis, UTI, nephrolithiasis, gastroparesis  MDM: Patient seen emergency room for evaluation of nausea and vomiting.  Physical exam  reveals an ill-appearing and uncomfortable patient but no appreciable abdominal tenderness on exam.  Laboratory evaluation largely unremarkable.  Urinalysis unremarkable.  CT abdomen pelvis with no acute intra-abdominal pathology.  Patient did breakthrough multiple doses of Zofran and was ultimately given droperidol and Ativan for cyclic vomiting versus abdominal migraine.  On reevaluation, patient dramatically improved and is able to tolerate p.o. without difficulty.  At this time, patient does not meet inpatient criteria for admission and is safe for discharge with outpatient follow-up.  Zofran prescribed and return precautions given which she voiced understanding.   Additional history obtained:  -External records from outside source  obtained and reviewed including: Chart review including previous notes, labs, imaging, consultation notes   Lab Tests: -I ordered, reviewed, and interpreted labs.   The pertinent results include:   Labs Reviewed  COMPREHENSIVE METABOLIC PANEL - Abnormal; Notable for the following components:      Result Value   Glucose, Bld 167 (*)    All other components within normal limits  URINALYSIS, ROUTINE W REFLEX MICROSCOPIC - Abnormal; Notable for the following components:   Specific Gravity, Urine >1.046 (*)    pH 9.0 (*)    Ketones, ur 5 (*)    Protein, ur 100 (*)    All other components within normal limits  CBG MONITORING, ED - Abnormal; Notable for the following components:   Glucose-Capillary 161 (*)    All other components within normal limits  LIPASE, BLOOD  CBC      EKG   EKG Interpretation  Date/Time:  Friday July 25 2022 17:58:32 EST Ventricular Rate:  65 PR Interval:  142 QRS Duration: 133 QT Interval:  426 QTC Calculation: 443 R Axis:   64 Text Interpretation: Sinus rhythm Nonspecific intraventricular conduction delay Confirmed by [place] ([id number]) on 07/25/2022 6:01:54 PM         Imaging Studies ordered: I ordered imaging studies including CTAP I independently visualized and interpreted imaging. I agree with the radiologist interpretation   Medicines ordered and prescription drug management: Meds ordered this encounter  Medications   ondansetron (ZOFRAN-ODT) disintegrating tablet 4 mg   lactated ringers bolus 1,000 mL   ondansetron (ZOFRAN) injection 4 mg   ketorolac (TORADOL) 15 MG/ML injection 15 mg   droperidol (INAPSINE) 2.5 MG/ML injection 1.25 mg   LORazepam (ATIVAN) injection 0.5 mg   iohexol (OMNIPAQUE) 300 MG/ML solution 100 mL   ondansetron (ZOFRAN-ODT) 4 MG disintegrating tablet    Sig: Take 1 tablet (4 mg total) by mouth every 8 (eight) hours as needed for nausea or vomiting.    Dispense:  20 tablet    Refill:  0    -I have  reviewed the patients home medicines and have made adjustments as needed  Critical interventions none   Cardiac Monitoring: The patient was maintained on a cardiac monitor.  I personally viewed and interpreted the cardiac monitored which showed an underlying rhythm of: NSR  Social Determinants of Health:  Factors impacting patients care include: none   Reevaluation: After the interventions noted above, I reevaluated the patient and found that they have :improved  Co morbidities that complicate the patient evaluation  Past Medical History:  Diagnosis Date   Anxiety    Chronic kidney disease 2017   Depression    Headache    history of   History of kidney stones    Hypertension 2007      Dispostion: I considered admission for this patient, but he does not meet inpatient criteria  for admission he is safe for discharge with outpatient follow-up     Final Clinical Impression(s) / ED Diagnoses Final diagnoses:  Nausea and vomiting, unspecified vomiting type     '@PCDICTATION'$ @    Teressa Lower, MD 07/25/22 2239

## 2022-07-25 NOTE — ED Triage Notes (Signed)
Per EMS- Patient reports that he began vomiting since 0900 today. Patient denies any diarrhea or abdominal pain.

## 2022-07-25 NOTE — ED Provider Triage Note (Signed)
Emergency Medicine Provider Triage Evaluation Note  Daking Westervelt , a 51 y.o. male  was evaluated in triage.  Pt complains of 10 episodes of nonbloody, nonbilious emesis that started on 9 AM this morning.  Denies diarrhea.  No abdominal pain.  Denies chest pain and shortness of breath.  Admits to marijuana use.  Review of Systems  Positive: N/V Negative: diarrhea  Physical Exam  BP (!) 153/74   Pulse 78   Temp 98.3 F (36.8 C) (Oral)   Resp 20   Ht '6\' 2"'$  (1.88 m)   Wt 106.6 kg   SpO2 100%   BMI 30.17 kg/m  Gen:   Awake, no distress   Resp:  Normal effort  MSK:   Moves extremities without difficulty  Other:    Medical Decision Making  Medically screening exam initiated at 5:15 PM.  Appropriate orders placed.  Cong Hightower was informed that the remainder of the evaluation will be completed by another provider, this initial triage assessment does not replace that evaluation, and the importance of remaining in the ED until their evaluation is complete.  Abdominal labs Zofran given in triage   Suzy Bouchard, PA-C 07/25/22 1716

## 2022-11-21 ENCOUNTER — Encounter (HOSPITAL_COMMUNITY): Payer: Self-pay | Admitting: Emergency Medicine

## 2022-11-21 ENCOUNTER — Emergency Department (HOSPITAL_COMMUNITY): Payer: Commercial Managed Care - HMO

## 2022-11-21 ENCOUNTER — Emergency Department (HOSPITAL_COMMUNITY)
Admission: EM | Admit: 2022-11-21 | Discharge: 2022-11-22 | Disposition: A | Discharge status: Home / Self Care | Payer: Commercial Managed Care - HMO | Attending: Emergency Medicine | Admitting: Emergency Medicine

## 2022-11-21 ENCOUNTER — Other Ambulatory Visit: Payer: Self-pay

## 2022-11-21 DIAGNOSIS — R112 Nausea with vomiting, unspecified: Secondary | ICD-10-CM

## 2022-11-21 DIAGNOSIS — Z85038 Personal history of other malignant neoplasm of large intestine: Secondary | ICD-10-CM | POA: Diagnosis not present

## 2022-11-21 LAB — COMPREHENSIVE METABOLIC PANEL
ALT: 19 U/L (ref 0–44)
AST: 35 U/L (ref 15–41)
Albumin: 4 g/dL (ref 3.5–5.0)
Alkaline Phosphatase: 95 U/L (ref 38–126)
Anion gap: 13 (ref 5–15)
BUN: 11 mg/dL (ref 6–20)
CO2: 19 mmol/L — ABNORMAL LOW (ref 22–32)
Calcium: 8.6 mg/dL — ABNORMAL LOW (ref 8.9–10.3)
Chloride: 103 mmol/L (ref 98–111)
Creatinine, Ser: 1.04 mg/dL (ref 0.61–1.24)
GFR, Estimated: >60 mL/min (ref >60)
Glucose, Bld: 159 mg/dL — ABNORMAL HIGH (ref 70–99)
Potassium: 3.6 mmol/L (ref 3.5–5.1)
Sodium: 135 mmol/L (ref 135–145)
Total Bilirubin: 1.2 mg/dL (ref 0.3–1.2)
Total Protein: 7.1 g/dL (ref 6.5–8.1)

## 2022-11-21 LAB — CBC WITH DIFFERENTIAL/PLATELET
Abs Immature Granulocytes: 0.03 10*3/uL (ref 0.00–0.07)
Basophils Absolute: 0 10*3/uL (ref 0.0–0.1)
Basophils Relative: 0 %
Eosinophils Absolute: 0 10*3/uL (ref 0.0–0.5)
Eosinophils Relative: 0 %
HCT: 35.8 % — ABNORMAL LOW (ref 39.0–52.0)
Hemoglobin: 12.5 g/dL — ABNORMAL LOW (ref 13.0–17.0)
Immature Granulocytes: 0 %
Lymphocytes Relative: 10 %
Lymphs Abs: 0.8 10*3/uL (ref 0.7–4.0)
MCH: 30.3 pg (ref 26.0–34.0)
MCHC: 34.9 g/dL (ref 30.0–36.0)
MCV: 86.7 fL (ref 80.0–100.0)
Monocytes Absolute: 0.4 10*3/uL (ref 0.1–1.0)
Monocytes Relative: 5 %
Neutro Abs: 7 10*3/uL (ref 1.7–7.7)
Neutrophils Relative %: 85 %
Platelets: 258 10*3/uL (ref 150–400)
RBC: 4.13 MIL/uL — ABNORMAL LOW (ref 4.22–5.81)
RDW: 12.4 % (ref 11.5–15.5)
WBC: 8.3 10*3/uL (ref 4.0–10.5)
nRBC: 0 % (ref 0.0–0.2)

## 2022-11-21 LAB — LIPASE, BLOOD: Lipase: 36 U/L (ref 11–51)

## 2022-11-21 MED ORDER — LACTATED RINGERS IV SOLN: INTRAVENOUS | Status: DC

## 2022-11-21 MED ORDER — LORAZEPAM 2 MG/ML PO CONC: 0.5000 mg | Freq: Once | ORAL | Status: DC

## 2022-11-21 MED ORDER — LACTATED RINGERS IV BOLUS
1000.0000 mL | Freq: Once | INTRAVENOUS | Status: AC
Start: 2022-11-21 — End: 2022-11-21
  Administered 2022-11-21: 1000 mL via INTRAVENOUS

## 2022-11-21 MED ORDER — DROPERIDOL 2.5 MG/ML IJ SOLN
1.2500 mg | Freq: Once | INTRAMUSCULAR | Status: AC
  Administered 2022-11-21: 1.25 mg via INTRAVENOUS
  Filled 2022-11-21: qty 2

## 2022-11-21 MED ORDER — LORAZEPAM 2 MG/ML IJ SOLN
0.5000 mg | Freq: Once | INTRAMUSCULAR | Status: AC
  Administered 2022-11-21: 0.5 mg via INTRAVENOUS
  Filled 2022-11-21: qty 1

## 2022-11-21 MED ORDER — ONDANSETRON HCL 4 MG/2ML IJ SOLN
4.0000 mg | Freq: Once | INTRAMUSCULAR | Status: AC
  Administered 2022-11-21: 4 mg via INTRAVENOUS
  Filled 2022-11-21: qty 2

## 2022-11-21 NOTE — ED Provider Notes (Signed)
[place] EMERGENCY DEPARTMENT AT [facility] Provider Note   CSN: [id number] Arrival date & time: 11/21/22  2116     History  Chief Complaint  Patient presents with   Emesis    Corey Gregory is a 52 y.o. male.  52 year old male presents with acute onset of nausea vomiting with diarrhea today.  The emesis is described as being bilious.  No associated fever but has had some chills.  History of similar episode several months ago and that visit was reviewed.  Unclear etiology.  Does have a history of colon cancer with partial colectomy.  Called EMS and was transported here       Home Medications Prior to Admission medications   Medication Sig Start Date End Date Taking? Authorizing Provider  COLLAGEN PO Take 1 tablet by mouth daily.    [provider]  lisinopril-hydrochlorothiazide (PRINZIDE,ZESTORETIC) 20-12.5 MG tablet Take 1 tablet by mouth every evening.     [provider]  magnesium oxide (MAG-OX) 400 MG tablet Take 400 mg by mouth daily.    [provider]  mirtazapine (REMERON) 15 MG tablet Take 15 mg by mouth at bedtime. 08/29/20   [provider]  ondansetron (ZOFRAN-ODT) 4 MG disintegrating tablet Take 1 tablet (4 mg total) by mouth every 8 (eight) hours as needed for nausea or vomiting. 07/25/22   Kommor, [place], MD  PARoxetine (PAXIL) 20 MG tablet Take 20 mg by mouth every evening.  10/08/15   [provider]  pravastatin (PRAVACHOL) 10 MG tablet Take 10 mg by mouth at bedtime. 08/19/20   [provider]  traMADol (ULTRAM) 50 MG tablet Take 1-2 tablets (50-100 mg total) by mouth every 6 (six) hours as needed for moderate pain or severe pain. 11/11/20   Michael Boston, MD      Allergies    Simvastatin    Review of Systems   Review of Systems  All other systems reviewed and are negative.   Physical Exam Updated Vital Signs BP 130/78   Pulse 83   Temp 97.6 F (36.4 C)   Resp 16   Ht 1.88 m (6\' 2" )    Wt 106 kg   SpO2 100%   BMI 30.00 kg/m  Physical Exam Vitals and nursing note reviewed.  Constitutional:      General: He is not in acute distress.    Appearance: Normal appearance. He is well-developed. He is not toxic-appearing.  HENT:     Head: Normocephalic and atraumatic.  Eyes:     General: Lids are normal.     Conjunctiva/sclera: Conjunctivae normal.     Pupils: Pupils are equal, round, and reactive to light.  Neck:     Thyroid: No thyroid mass.     Trachea: No tracheal deviation.  Cardiovascular:     Rate and Rhythm: Normal rate and regular rhythm.     Heart sounds: Normal heart sounds. No murmur heard.    No gallop.  Pulmonary:     Effort: Pulmonary effort is normal. No respiratory distress.     Breath sounds: Normal breath sounds. No stridor. No decreased breath sounds, wheezing, rhonchi or rales.  Abdominal:     General: There is no distension.     Palpations: Abdomen is soft.     Tenderness: There is no abdominal tenderness. There is no rebound.  Musculoskeletal:        General: No tenderness. Normal range of motion.     Cervical back: Normal range of motion and  neck supple.  Skin:    General: Skin is warm and dry.     Findings: No abrasion or rash.  Neurological:     Mental Status: He is alert and oriented to person, place, and time. Mental status is at baseline.     GCS: GCS eye subscore is 4. GCS verbal subscore is 5. GCS motor subscore is 6.     Cranial Nerves: No cranial nerve deficit.     Sensory: No sensory deficit.     Motor: Motor function is intact.  Psychiatric:        Attention and Perception: Attention normal.        Speech: Speech is delayed.        Behavior: Behavior normal.     ED Results / Procedures / Treatments   Labs (all labs ordered are listed, but only abnormal results are displayed) Labs Reviewed  CBC WITH DIFFERENTIAL/PLATELET  COMPREHENSIVE METABOLIC PANEL  LIPASE, BLOOD    EKG EKG Interpretation  Date/Time:  Friday  November 21 2022 22:22:15 EDT Ventricular Rate:  69 PR Interval:  174 QRS Duration: 105 QT Interval:  451 QTC Calculation: 484 R Axis:   47 Text Interpretation: Sinus rhythm Borderline prolonged QT interval Confirmed by Lacretia Leigh ([id number]) on 11/21/2022 11:31:44 PM  Radiology No results found.  Procedures Procedures    Medications Ordered in ED Medications  lactated ringers bolus 1,000 mL (has no administration in time range)  lactated ringers infusion (has no administration in time range)  LORazepam (ATIVAN) 2 MG/ML concentrated solution 0.5 mg (has no administration in time range)  droperidol (INAPSINE) 2.5 MG/ML injection 1.25 mg (has no administration in time range)  ondansetron (ZOFRAN) injection 4 mg (has no administration in time range)    ED Course/ Medical Decision Making/ A&P                             Medical Decision Making Amount and/or Complexity of Data Reviewed Labs: ordered. Radiology: ordered. ECG/medicine tests: ordered.  Risk Prescription drug management.  His EKG for interpretation shows normal sinus rhythm.  No acute ischemic changes. Patient endorses that he does use marijuana daily.  Suspect that there is some element of cannabis hyperemesis here.  Given IV fluids and antiemetics and feels better.  Labs are reassuring here.  Due to patient's history of colon cancer with colectomy patient abdominal CT which per my review interpretation showed no evidence of obstruction.  Radiologist did recommend urinalysis and I spoke with the patient and he denies any urinary symptoms.  Patient defers wanting to have a urinalysis at this time.  I explained to him he could have an infection and he understands this.  Return precautions given       Final Clinical Impression(s) / ED Diagnoses Final diagnoses:  None    Rx / DC Orders ED Discharge Orders     None         Lacretia Leigh, MD 11/21/22 2332

## 2023-02-09 ENCOUNTER — Encounter (HOSPITAL_BASED_OUTPATIENT_CLINIC_OR_DEPARTMENT_OTHER): Payer: Self-pay | Admitting: Emergency Medicine

## 2023-02-09 ENCOUNTER — Emergency Department (HOSPITAL_BASED_OUTPATIENT_CLINIC_OR_DEPARTMENT_OTHER)
Admission: EM | Admit: 2023-02-09 | Discharge: 2023-02-09 | Disposition: A | Discharge status: Home / Self Care | Payer: Commercial Managed Care - HMO | Attending: Emergency Medicine | Admitting: Emergency Medicine

## 2023-02-09 ENCOUNTER — Other Ambulatory Visit: Payer: Self-pay

## 2023-02-09 DIAGNOSIS — R112 Nausea with vomiting, unspecified: Secondary | ICD-10-CM | POA: Diagnosis not present

## 2023-02-09 DIAGNOSIS — Z85038 Personal history of other malignant neoplasm of large intestine: Secondary | ICD-10-CM | POA: Diagnosis not present

## 2023-02-09 HISTORY — DX: Malignant neoplasm of colon, unspecified: C18.9

## 2023-02-09 LAB — COMPREHENSIVE METABOLIC PANEL
ALT: 14 U/L (ref 0–44)
AST: 24 U/L (ref 15–41)
Albumin: 4.3 g/dL (ref 3.5–5.0)
Alkaline Phosphatase: 84 U/L (ref 38–126)
Anion gap: 12 (ref 5–15)
BUN: 10 mg/dL (ref 6–20)
CO2: 24 mmol/L (ref 22–32)
Calcium: 9.3 mg/dL (ref 8.9–10.3)
Chloride: 101 mmol/L (ref 98–111)
Creatinine, Ser: 0.93 mg/dL (ref 0.61–1.24)
GFR, Estimated: >60 mL/min (ref >60)
Glucose, Bld: 131 mg/dL — ABNORMAL HIGH (ref 70–99)
Potassium: 3.5 mmol/L (ref 3.5–5.1)
Sodium: 137 mmol/L (ref 135–145)
Total Bilirubin: 0.7 mg/dL (ref 0.3–1.2)
Total Protein: 7.1 g/dL (ref 6.5–8.1)

## 2023-02-09 LAB — CBC WITH DIFFERENTIAL/PLATELET
Abs Immature Granulocytes: 0.04 10*3/uL (ref 0.00–0.07)
Basophils Absolute: 0 10*3/uL (ref 0.0–0.1)
Basophils Relative: 0 %
Eosinophils Absolute: 0 10*3/uL (ref 0.0–0.5)
Eosinophils Relative: 0 %
HCT: 37.6 % — ABNORMAL LOW (ref 39.0–52.0)
Hemoglobin: 13.4 g/dL (ref 13.0–17.0)
Immature Granulocytes: 0 %
Lymphocytes Relative: 11 %
Lymphs Abs: 1.1 10*3/uL (ref 0.7–4.0)
MCH: 30.3 pg (ref 26.0–34.0)
MCHC: 35.6 g/dL (ref 30.0–36.0)
MCV: 85.1 fL (ref 80.0–100.0)
Monocytes Absolute: 0.4 10*3/uL (ref 0.1–1.0)
Monocytes Relative: 4 %
Neutro Abs: 8.1 10*3/uL — ABNORMAL HIGH (ref 1.7–7.7)
Neutrophils Relative %: 85 %
Platelets: 253 10*3/uL (ref 150–400)
RBC: 4.42 MIL/uL (ref 4.22–5.81)
RDW: 12.8 % (ref 11.5–15.5)
WBC: 9.6 10*3/uL (ref 4.0–10.5)
nRBC: 0 % (ref 0.0–0.2)

## 2023-02-09 LAB — LIPASE, BLOOD: Lipase: 62 U/L — ABNORMAL HIGH (ref 11–51)

## 2023-02-09 MED ORDER — LORAZEPAM 2 MG/ML IJ SOLN
1.0000 mg | Freq: Once | INTRAMUSCULAR | Status: AC
  Administered 2023-02-09: 1 mg via INTRAVENOUS
  Filled 2023-02-09: qty 1

## 2023-02-09 MED ORDER — ONDANSETRON HCL 4 MG/2ML IJ SOLN
4.0000 mg | Freq: Once | INTRAMUSCULAR | Status: AC
  Administered 2023-02-09: 4 mg via INTRAVENOUS
  Filled 2023-02-09: qty 2

## 2023-02-09 MED ORDER — SODIUM CHLORIDE 0.9 % IV BOLUS
1000.0000 mL | Freq: Once | INTRAVENOUS | Status: AC
  Administered 2023-02-09: 1000 mL via INTRAVENOUS

## 2023-02-09 MED ORDER — DROPERIDOL 2.5 MG/ML IJ SOLN
2.5000 mg | Freq: Once | INTRAMUSCULAR | Status: AC
  Administered 2023-02-09: 2.5 mg via INTRAVENOUS
  Filled 2023-02-09: qty 2

## 2023-02-09 NOTE — ED Notes (Signed)
Discharge instructions discussed with pt. Pt verbalized understanding. Pt stable and ambulatory.  °

## 2023-02-09 NOTE — ED Triage Notes (Addendum)
Pt presents to ED POV. Pt c/o emesis since last night. Pt reports this is the 4th time he has had an episode similar to this since  being in remission from colon cancer. Pt reports having 5 zofran and 1 clonazepam since 1600 yesterday.

## 2023-02-09 NOTE — ED Provider Notes (Signed)
[place] EMERGENCY DEPARTMENT AT [facility] Provider Note   CSN: [id number] Arrival date & time: 02/09/23  0251     History  Chief Complaint  Patient presents with   Emesis    Riad Wagley is a 52 y.o. male.  Patient is a 52 year old male with past medical history of colon cancer status post bowel resection, hyperlipidemia.  Patient presenting today with complaints of nausea and vomiting.  This started yesterday afternoon and has gradually worsened.  This is the fourth episode he has had over the past several months with similar presentation.  These episodes have thus far been unexplained.  He has undergone CT imaging, but no cause has been found.  Each time he was given Ativan, droperidol, and Zofran and symptoms finally subsided.  Patient denies any bladder complaints.  No fevers or chills.  The history is provided by the patient.       Home Medications Prior to Admission medications   Medication Sig Start Date End Date Taking? Authorizing Provider  COLLAGEN PO Take 1 tablet by mouth daily.    [provider]  lisinopril-hydrochlorothiazide (PRINZIDE,ZESTORETIC) 20-12.5 MG tablet Take 1 tablet by mouth every evening.     [provider]  magnesium oxide (MAG-OX) 400 MG tablet Take 400 mg by mouth daily.    [provider]  mirtazapine (REMERON) 15 MG tablet Take 15 mg by mouth at bedtime. 08/29/20   [provider]  ondansetron (ZOFRAN-ODT) 4 MG disintegrating tablet Take 1 tablet (4 mg total) by mouth every 8 (eight) hours as needed for nausea or vomiting. 07/25/22   Kommor, Madison, MD  PARoxetine (PAXIL) 20 MG tablet Take 20 mg by mouth every evening.  10/08/15   [provider]  pravastatin (PRAVACHOL) 10 MG tablet Take 10 mg by mouth at bedtime. 08/19/20   [provider]  traMADol (ULTRAM) 50 MG tablet Take 1-2 tablets (50-100 mg total) by mouth every 6 (six) hours as needed for moderate pain or severe pain.  11/11/20   Karie Soda, MD      Allergies    Simvastatin    Review of Systems   Review of Systems  All other systems reviewed and are negative.   Physical Exam Updated Vital Signs BP (!) 174/89 (BP Location: Right Arm)   Pulse 74   Temp 98.2 F (36.8 C) (Oral)   Resp 18   SpO2 100%  Physical Exam Vitals and nursing note reviewed.  Constitutional:      General: He is not in acute distress.    Appearance: He is well-developed. He is not diaphoretic.  HENT:     Head: Normocephalic and atraumatic.  Cardiovascular:     Rate and Rhythm: Normal rate and regular rhythm.     Heart sounds: No murmur heard.    No friction rub.  Pulmonary:     Effort: Pulmonary effort is normal. No respiratory distress.     Breath sounds: Normal breath sounds. No wheezing or rales.  Abdominal:     General: Bowel sounds are normal. There is no distension.     Palpations: Abdomen is soft.     Tenderness: There is no abdominal tenderness.  Musculoskeletal:        General: Normal range of motion.     Cervical back: Normal range of motion and neck supple.  Skin:    General: Skin is warm and dry.  Neurological:     Mental Status: He is alert and oriented to person, place,  and time.     Coordination: Coordination normal.     ED Results / Procedures / Treatments   Labs (all labs ordered are listed, but only abnormal results are displayed) Labs Reviewed  COMPREHENSIVE METABOLIC PANEL  LIPASE, BLOOD  CBC WITH DIFFERENTIAL/PLATELET    EKG EKG Interpretation  Date/Time:  Monday February 09 2023 03:47:31 EDT Ventricular Rate:  73 PR Interval:  163 QRS Duration: 88 QT Interval:  417 QTC Calculation: 460 R Axis:   62 Text Interpretation: Sinus rhythm Borderline prolonged QT No significant change since 11/21/2022 Confirmed by Geoffery Lyons ([id number]) on 02/09/2023 4:05:14 AM  Radiology No results found.  Procedures Procedures    Medications Ordered in ED Medications  sodium chloride 0.9 %  bolus 1,000 mL (has no administration in time range)  ondansetron (ZOFRAN) injection 4 mg (has no administration in time range)  LORazepam (ATIVAN) injection 1 mg (has no administration in time range)  droperidol (INAPSINE) 2.5 MG/ML injection 2.5 mg (has no administration in time range)    ED Course/ Medical Decision Making/ A&P  Patient is a 52 year old male with history of colon cancer status post colon resection in the past.  He presents today with nausea and vomiting.  This is the fourth time he has experienced the symptoms since the surgery.  No definitive cause has been found.  Patient arrives here with stable vital signs and is afebrile.  Physical examination basically unremarkable with benign abdomen.  Workup initiated including CBC, CMP, and lipase.  He has no white count, no elevation of his LFTs or lipase, and no electrolyte derangement.  He has been hydrated with normal saline and given droperidol, Zofran, and Ativan as this combination has helped him in the past.  He has been reassessed and now feeling much better.  Patient seems appropriate for discharge.  I will refer to gastroenterology.  Patient to return as needed.  Final Clinical Impression(s) / ED Diagnoses Final diagnoses:  None    Rx / DC Orders ED Discharge Orders     None         Geoffery Lyons, MD 02/09/23 [phone]

## 2023-02-09 NOTE — Discharge Instructions (Signed)
Continue medications as previously prescribed.  Follow-up with gastroenterology in the next week, and return to the ER if you develop severe abdominal pain, worsening vomiting, high fever, or other new and concerning symptoms.
